# Patient Record
Sex: Female | Born: 2000 | Race: White | Hispanic: No | Marital: Single | Smoking: Never smoker
Health system: Southern US, Community
[De-identification: ages and names within clinical notes are randomized; demographics above are authoritative.]

## PROBLEM LIST (undated history)

## (undated) DIAGNOSIS — F419 Anxiety disorder, unspecified: Secondary | ICD-10-CM

## (undated) DIAGNOSIS — F840 Autistic disorder: Secondary | ICD-10-CM

## (undated) DIAGNOSIS — F32A Depression, unspecified: Secondary | ICD-10-CM

## (undated) DIAGNOSIS — F909 Attention-deficit hyperactivity disorder, unspecified type: Secondary | ICD-10-CM

## (undated) HISTORY — PX: FOOT SURGERY: SHX648

## (undated) HISTORY — DX: Autistic disorder: F84.0

## (undated) HISTORY — DX: Attention-deficit hyperactivity disorder, unspecified type: F90.9

---
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
## 2022-01-01 ENCOUNTER — Ambulatory Visit: Payer: BC Managed Care – PPO | Admitting: Family

## 2022-01-01 ENCOUNTER — Encounter: Payer: Self-pay | Admitting: Family

## 2022-01-01 VITALS — BP 110/78 | HR 96 | Temp 98.1°F | Ht 61.0 in | Wt 170.5 lb

## 2022-01-01 DIAGNOSIS — R519 Headache, unspecified: Secondary | ICD-10-CM

## 2022-01-01 HISTORY — DX: Headache, unspecified: R51.9

## 2022-01-01 MED ORDER — CYCLOBENZAPRINE HCL 5 MG PO TABS: 5.0000 mg | ORAL_TABLET | Freq: Three times a day (TID) | ORAL | 0 refills | Status: DC | PRN

## 2022-01-01 MED ORDER — PROPRANOLOL HCL 10 MG PO TABS: 10.0000 mg | ORAL_TABLET | Freq: Two times a day (BID) | ORAL | 0 refills | Status: DC

## 2022-01-01 NOTE — Patient Instructions (Addendum)
It was very nice to see you today!   I have sent over Propranolol a medication to help prevent your headaches. Start this twice a day and if needed you can increase to 3 times per day if still having 1-2 headaches per week. This medicine is safe to take with your other meds. Continue to take the Flexeril I sent last time to treat your acute headache pain.  Follow up in 1 month.    PLEASE NOTE:  If you had any lab tests please let us know if you have not heard back within a few days. You may see your results on MyChart before we have a chance to review them but we will give you a call once they are reviewed by [place]. If we ordered any referrals today, please let us know if you have not heard from their office within the next week.

## 2022-01-01 NOTE — Progress Notes (Signed)
Subjective:     Patient ID: Amber Watts, female    DOB: Nov 25, 2000, 21 y.o.   MRN: [id number]  Chief Complaint  Patient presents with   Headache    Pt states her headaches are still the same Bt the medication she was given does help but headaches do come back.     HPI: Persistent headaches:  reports having headaches at least 2 per week, had more severe migraines 4 years ago. Reports nausea and dizziness sometimes. Most episodes she reports waking with the headache. Denies any known food allergies, but does feel bad after eating pork. Allergy panel run last visit negative. She reports her Cymbalta dose increased 2 months ago, but she discussed the med as a possible cause of her HA with her psychiatrist who dismissed the idea.   Assessment & Plan:   Problem List Items Addressed This Visit       Other   Persistent headaches - Primary    pt reports Flexeril helping but the HA returns and can continue for 3-4 days. she has asked her PSYCH about Cymbalta causing HA, but they said no. Will send propranolol for preventative. Continue Flexeril prn for acute pain. Advised on use & SE.      Relevant Medications   propranolol (INDERAL) 10 MG tablet   cyclobenzaprine (FLEXERIL) 5 MG tablet    Outpatient Medications Prior to Visit  Medication Sig Dispense Refill   clindamycin (CLEOCIN T) 1 % lotion      DULoxetine (CYMBALTA) 30 MG capsule Take 3 capsules (90 mg total) by mouth daily. 270 capsule 0   ISOtretinoin (ACCUTANE) 30 MG capsule Take 30 mg by mouth daily.     mupirocin ointment (BACTROBAN) 2 % 3 (three) times daily.     sertraline (ZOLOFT) 25 MG tablet BEGIN 5 DAYS BEFORE MENSES BEGINS AND DAYS 1 AND 2 OF CYCLE, THEN STOP 90 tablet 1   Adapalene 0.3 % gel      cyclobenzaprine (FLEXERIL) 5 MG tablet Take 1-2 tablets (5-10 mg total) by mouth 3 (three) times daily as needed for muscle spasms (Headaches). 20 tablet 0   hydrOXYzine (ATARAX) 25 MG tablet TAKE 1 TO 2 TABLETS (25 TO 50  MG TOTAL) BY MOUTH AT BEDTIME (Patient not taking: Reported on 01/01/2022) 180 tablet 1   No facility-administered medications prior to visit.    Past Medical History:  Diagnosis Date   ADHD (attention deficit hyperactivity disorder)    Anxiety    Depression     Past Surgical History:  Procedure Laterality Date   FOOT SURGERY      No Known Allergies     Objective:    Physical Exam Vitals and nursing note reviewed.  Constitutional:      Appearance: Normal appearance.  Cardiovascular:     Rate and Rhythm: Normal rate and regular rhythm.  Pulmonary:     Effort: Pulmonary effort is normal.     Breath sounds: Normal breath sounds.  Musculoskeletal:        General: Normal range of motion.  Skin:    General: Skin is warm and dry.  Neurological:     Mental Status: She is alert.  Psychiatric:        Mood and Affect: Mood normal.        Behavior: Behavior normal.     BP 110/78 (BP Location: Left Arm, Patient Position: Sitting, Cuff Size: Large)   Pulse 96   Temp 98.1 F (36.7 C) (Temporal)   Ht  5\' 1"  (1.549 m)   Wt 170 lb 8 oz (77.3 kg)   LMP 12/23/2021 (Exact Date)   SpO2 99%   BMI 32.22 kg/m  Wt Readings from Last 3 Encounters:  01/01/22 170 lb 8 oz (77.3 kg) (92 %, Z= 1.41)*  12/04/21 169 lb 8 oz (76.9 kg) (92 %, Z= 1.39)*  07/03/21 166 lb (75.3 kg) (91 %, Z= 1.34)*   * Growth percentiles are based on CDC (Girls, 2-20 Years) data.        Meds ordered this encounter  Medications   propranolol (INDERAL) 10 MG tablet    Sig: Take 1 tablet (10 mg total) by mouth 2 (two) times daily. For headache prevention. OK to increase to 3 times daily if needed.    Dispense:  60 tablet    Refill:  0    Order Specific Question:   Supervising Provider    Answer:   ANDY, CAMILLE L [[id number]]   cyclobenzaprine (FLEXERIL) 5 MG tablet    Sig: Take 1-2 tablets (5-10 mg total) by mouth 3 (three) times daily as needed for muscle spasms (Headaches).    Dispense:  30 tablet     Refill:  0    Order Specific Question:   Supervising Provider    Answer:   ANDY, CAMILLE L [[id number]]    08/31/21, NP

## 2022-01-01 NOTE — Assessment & Plan Note (Addendum)
pt reports Flexeril helping but the HA returns and can continue for 3-4 days. she has asked her PSYCH about Cymbalta causing HA, but they said no. Will send propranolol for preventative. Continue Flexeril prn for acute pain. Advised on use & SE.

## 2022-01-20 ENCOUNTER — Other Ambulatory Visit: Payer: Self-pay | Admitting: Physician Assistant

## 2022-01-31 ENCOUNTER — Encounter: Payer: Self-pay | Admitting: Family

## 2022-01-31 ENCOUNTER — Ambulatory Visit: Payer: BC Managed Care – PPO | Admitting: Family

## 2022-01-31 VITALS — BP 110/62 | HR 86 | Temp 99.0°F | Ht 61.0 in | Wt 172.2 lb

## 2022-01-31 DIAGNOSIS — R519 Headache, unspecified: Secondary | ICD-10-CM | POA: Diagnosis not present

## 2022-01-31 DIAGNOSIS — R52 Pain, unspecified: Secondary | ICD-10-CM | POA: Diagnosis not present

## 2022-01-31 MED ORDER — PROPRANOLOL HCL 10 MG PO TABS: 10.0000 mg | ORAL_TABLET | Freq: Two times a day (BID) | ORAL | 2 refills | Status: DC

## 2022-01-31 NOTE — Patient Instructions (Signed)
It was very nice to see you today!   As discussed, try different exercises to try and prevent overusing your muscles and joints doing same exercise. Also encourage gentle weight lifting to build strength.  Recommend working with a Scientist, physiological at [employer] knowledgeable with how to use the gym equipment.  Swimming is a great overall body exercise if your gym has a pool.  You can take up 600mg  of Ibuprofen (Advil) 3 times per day, or 1-2 generic Aleve twice a day to help with your aches/pains as needed. Taking for 5-7 days is safe, but if you are having to take every day to relieve pain, then we need to discuss other options.  Have a great weekend!    PLEASE NOTE:  If you had any lab tests please let know if you have not heard back within a few days. You may see your results on MyChart before we have a chance to review them but we will give you a call once they are reviewed by [place]. If we ordered any referrals today, please let us know if you have not heard from their office within the next week.

## 2022-01-31 NOTE — Assessment & Plan Note (Addendum)
   flexeril helps but HA returns  propranolol 10mg  tid prn given last visit, pt reports decrease in # of headaches, states she is taking 1 pill bid  advised ok to increase to tid, or can take 2 pills at one time  Refill sent to pharmacy  f/u in 3 mos or prn

## 2022-01-31 NOTE — Progress Notes (Signed)
Patient ID: Symphony Demuro, female    DOB: [birth year], 21 y.o.   MRN: [id number]  Chief Complaint  Patient presents with   Headache    Follow up, Pt states headaches have been better.    Generalized Body Aches    Pt c/o generalized body aches and stiffness for a while. Pt states it is on and off.     HPI: Persistent headaches:  previously reported having headaches at least 2 per week, had more severe migraines 4 years ago.  Denies any known food allergies, but does feel bad after eating pork. Allergy panel negative. Started Propranolol and states it is helping reduce her headache down to 1 per week, usually starts during the day vs. waking with HA. General body aches:  reports feeling achy and stiffness when she walks for exercise, denies any pain when resting. Denies any joint pain or paresthesias.   Assessment & Plan:   Problem List Items Addressed This Visit       Other   Persistent headaches - Primary    flexeril helps but HA returns propranolol 10mg  tid prn given last visit, pt reports decrease in # of headaches, states she is taking 1 pill bid advised ok to increase to tid, or can take 2 pills at one time Refill sent to pharmacy f/u in 3 mos or prn      Relevant Medications   propranolol (INDERAL) 10 MG tablet   Other Visit Diagnoses     Generalized body aches    - Advised pt on proper daily hydration of 2L water. Change exercise and try different equipment at [facility] rec center, can ask trainers to demonstrate different things to try. Encourage daily exercise, avoid eating too many carbs and sweets. Can take 1000mg  of Tylenol tid, 600mg  Ibuprofen tid, or 550mg  of Aleve bid to help with pain prn.      Subjective:    Outpatient Medications Prior to Visit  Medication Sig Dispense Refill   clindamycin (CLEOCIN T) 1 % lotion      cyclobenzaprine (FLEXERIL) 5 MG tablet Take 1-2 tablets (5-10 mg total) by mouth 3 (three) times daily as needed for muscle spasms (Headaches).  30 tablet 0   DULoxetine (CYMBALTA) 30 MG capsule Take 3 capsules (90 mg total) by mouth daily. 270 capsule 0   hydrOXYzine (ATARAX) 25 MG tablet TAKE 1 TO 2 TABLETS (25 TO 50 MG TOTAL) BY MOUTH AT BEDTIME 180 tablet 1   ISOtretinoin (ACCUTANE) 30 MG capsule Take 30 mg by mouth daily.     mupirocin ointment (BACTROBAN) 2 % 3 (three) times daily.     sertraline (ZOLOFT) 25 MG tablet BEGIN 5 DAYS BEFORE MENSES BEGINS AND DAYS 1 AND 2 OF CYCLE, THEN STOP 90 tablet 1   propranolol (INDERAL) 10 MG tablet Take 1 tablet (10 mg total) by mouth 2 (two) times daily. For headache prevention. OK to increase to 3 times daily if needed. 60 tablet 0   No facility-administered medications prior to visit.   Past Medical History:  Diagnosis Date   ADHD (attention deficit hyperactivity disorder)    Anxiety    Depression    Past Surgical History:  Procedure Laterality Date   FOOT SURGERY     No Known Allergies    Objective:    Physical Exam Vitals and nursing note reviewed.  Constitutional:      Appearance: Normal appearance.  Cardiovascular:     Rate and Rhythm: Normal rate and regular rhythm.  Pulmonary:  Effort: Pulmonary effort is normal.     Breath sounds: Normal breath sounds.  Musculoskeletal:        General: Normal range of motion.  Skin:    General: Skin is warm and dry.  Neurological:     Mental Status: She is alert.  Psychiatric:        Mood and Affect: Mood normal.        Behavior: Behavior normal.    BP 110/62 (BP Location: Left Arm, Patient Position: Sitting, Cuff Size: Large)   Pulse 86   Temp 99 F (37.2 C) (Temporal)   Ht 5\' 1"  (1.549 m)   Wt 172 lb 4 oz (78.1 kg)   LMP 01/21/2022 (Exact Date)   SpO2 98%   BMI 32.55 kg/m  Wt Readings from Last 3 Encounters:  01/31/22 172 lb 4 oz (78.1 kg) (93 %, Z= 1.44)*  01/01/22 170 lb 8 oz (77.3 kg) (92 %, Z= 1.41)*  12/04/21 169 lb 8 oz (76.9 kg) (92 %, Z= 1.39)*   * Growth percentiles are based on CDC (Girls, 2-20  Years) data.      02/03/22, NP

## 2022-02-26 ENCOUNTER — Ambulatory Visit: Payer: BC Managed Care – PPO | Admitting: Physician Assistant

## 2022-03-05 ENCOUNTER — Encounter: Payer: Self-pay | Admitting: Physician Assistant

## 2022-03-05 ENCOUNTER — Ambulatory Visit: Payer: BC Managed Care – PPO | Admitting: Physician Assistant

## 2022-03-05 VITALS — BP 117/78 | HR 67

## 2022-03-05 DIAGNOSIS — G4700 Insomnia, unspecified: Secondary | ICD-10-CM

## 2022-03-05 DIAGNOSIS — F900 Attention-deficit hyperactivity disorder, predominantly inattentive type: Secondary | ICD-10-CM

## 2022-03-05 DIAGNOSIS — F411 Generalized anxiety disorder: Secondary | ICD-10-CM

## 2022-03-05 DIAGNOSIS — F3341 Major depressive disorder, recurrent, in partial remission: Secondary | ICD-10-CM | POA: Diagnosis not present

## 2022-03-05 MED ORDER — LISDEXAMFETAMINE DIMESYLATE 20 MG PO CAPS: 20.0000 mg | ORAL_CAPSULE | Freq: Every day | ORAL | 0 refills | Status: DC

## 2022-03-05 NOTE — Progress Notes (Signed)
[facility]  Patient ID: Amber Watts,  MRN: [id number]  PCP: Dulce Sellar, NP  Date of Evaluation: 03/05/2022 Time spent:20 minutes  Chief Complaint:  Chief Complaint   ADD    HISTORY/CURRENT STATUS: HPI Would like to re-try something for attention  Took Focalin XR in the past and it really helped, but caused tremor in hands.  The tremor went away when she stopped the Focalin.  The tremor never affected her eating or writing.  She is wondering if she could go back on that or takes something else that would be helpful.  She has a hard time focusing.  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  She enjoys her classes but her mind wanders a lot.  She has 2 classes in the late afternoon and it is even more difficult than.  She procrastinates a lot which did not happen when she was on the Focalin in the past.  Patient is able to enjoy things.  Energy and motivation are good.  No extreme sadness, tearfulness, or feelings of hopelessness.  Sleeps well most of the time, takes the hydroxyzine every evening.  ADLs and personal hygiene are normal.   Appetite has not changed.  Weight is stable.  Denies laxative use, calorie restricting, or binging and purging.   Denies cutting or any form of self-harm.  Denies suicidal or homicidal thoughts.  She is still on Accutane for acne.  The treatment is going well.  Zoloft is helpful for PMDD.  Not having excessive anxiety.  She is on propranolol for migraine prevention.  Denies dizziness, syncope, seizures, numbness, tingling, tremor, tics, unsteady gait, slurred speech, confusion. Denies muscle or joint pain, stiffness, or dystonia.  Individual Medical History/ Review of Systems: Changes? :No   Past medications for mental health diagnoses include: Prozac, Lexapro, Focalin XR caused tremor in hands, Hydroxyzine, Buspar, Zoloft premenstrually  Allergies: Patient has no known allergies.  Current Medications:  Current  Outpatient Medications:    cyclobenzaprine (FLEXERIL) 5 MG tablet, Take 1-2 tablets (5-10 mg total) by mouth 3 (three) times daily as needed for muscle spasms (Headaches)., Disp: 30 tablet, Rfl: 0   DULoxetine (CYMBALTA) 30 MG capsule, Take 3 capsules (90 mg total) by mouth daily., Disp: 270 capsule, Rfl: 0   hydrOXYzine (ATARAX) 25 MG tablet, TAKE 1 TO 2 TABLETS (25 TO 50 MG TOTAL) BY MOUTH AT BEDTIME, Disp: 180 tablet, Rfl: 1   ISOtretinoin (ACCUTANE) 30 MG capsule, Take 30 mg by mouth daily., Disp: , Rfl:    lisdexamfetamine (VYVANSE) 20 MG capsule, Take 1 capsule (20 mg total) by mouth daily., Disp: 30 capsule, Rfl: 0   propranolol (INDERAL) 10 MG tablet, Take 1-2 tablets (10-20 mg total) by mouth 2 (two) times daily. For headache prevention. OK to increase to 3 times daily if needed., Disp: 75 tablet, Rfl: 2   sertraline (ZOLOFT) 25 MG tablet, BEGIN 5 DAYS BEFORE MENSES BEGINS AND DAYS 1 AND 2 OF CYCLE, THEN STOP, Disp: 90 tablet, Rfl: 1   clindamycin (CLEOCIN T) 1 % lotion, , Disp: , Rfl:    mupirocin ointment (BACTROBAN) 2 %, 3 (three) times daily. (Patient not taking: Reported on 03/05/2022), Disp: , Rfl:  Medication Side Effects: none  Family Medical/ Social History: Changes? No  MENTAL HEALTH EXAM:  Blood pressure 117/78, pulse 67.There is no height or weight on file to calculate BMI.  General Appearance: Casual and Well Groomed  Eye Contact:  Good  Speech:  Clear and  Coherent and Normal Rate  Volume:  Normal  Mood:  Euthymic  Affect:  Congruent  Thought Process:  Goal Directed and Descriptions of Associations: Circumstantial  Orientation:  Full (Time, Place, and Person)  Thought Content: Logical   Suicidal Thoughts:  No  Homicidal Thoughts:  No  Memory:  WNL  Judgement:  Good  Insight:  Good  Psychomotor Activity:  Normal  Concentration:  Concentration: Fair and Attention Span: Fair  Recall:  Good  Fund of Knowledge: Good  Language: Good  Assets:  Desire for Improvement   ADL's:  Intact  Cognition: WNL  Prognosis:  Good   DIAGNOSES:    ICD-10-CM   1. Attention deficit hyperactivity disorder (ADHD), predominantly inattentive type  F90.0     2. Recurrent major depression in partial remission (HCC)  F33.41     3. Generalized anxiety disorder  F41.1     4. Insomnia, unspecified type  G47.00       Receiving Psychotherapy: Yes    [name]  RECOMMENDATIONS:  PDMP was reviewed.  Last Focalin filled 03/25/2021. I provided 20 minutes of face to face time during this encounter, including time spent before and after the visit in records review, medical decision making, counseling pertinent to today's visit, and charting.   We discussed different treatment options for ADD.  She has only taken Focalin in the past.  I recommend trying something in the amphetamine group, specifically Vyvanse, as there is usually a smoother release of the active ingredient throughout the day.  We discussed benefits, risks and possible side effects and she accepts.  Continue Cymbalta 90 mg daily. Continue hydroxyzine 25 mg, 1-2 p.o. nightly as needed sleep. Start Vyvanse 20 mg, 1 p.o. every morning. Continue Zoloft 25 mg p.o. daily beginning 5 days prior to menses and then days 1 and 2 of the cycle and then stop it.  Continue multivitamin, B complex, and iron. Continue therapy with [name]. Return in 4 weeks.  [name], PA-C

## 2022-03-08 ENCOUNTER — Other Ambulatory Visit: Payer: Self-pay | Admitting: Physician Assistant

## 2022-03-17 ENCOUNTER — Ambulatory Visit: Payer: BC Managed Care – PPO | Admitting: Physician Assistant

## 2022-03-21 ENCOUNTER — Ambulatory Visit: Payer: BC Managed Care – PPO | Admitting: Physician Assistant

## 2022-03-24 ENCOUNTER — Encounter: Payer: Self-pay | Admitting: *Deleted

## 2022-04-02 ENCOUNTER — Encounter: Payer: Self-pay | Admitting: Physician Assistant

## 2022-04-02 ENCOUNTER — Ambulatory Visit (INDEPENDENT_AMBULATORY_CARE_PROVIDER_SITE_OTHER): Payer: BC Managed Care – PPO | Admitting: Physician Assistant

## 2022-04-02 DIAGNOSIS — F3341 Major depressive disorder, recurrent, in partial remission: Secondary | ICD-10-CM | POA: Diagnosis not present

## 2022-04-02 DIAGNOSIS — F411 Generalized anxiety disorder: Secondary | ICD-10-CM

## 2022-04-02 DIAGNOSIS — F3281 Premenstrual dysphoric disorder: Secondary | ICD-10-CM | POA: Diagnosis not present

## 2022-04-02 DIAGNOSIS — F900 Attention-deficit hyperactivity disorder, predominantly inattentive type: Secondary | ICD-10-CM

## 2022-04-02 MED ORDER — LISDEXAMFETAMINE DIMESYLATE 40 MG PO CAPS: 40.0000 mg | ORAL_CAPSULE | ORAL | 0 refills | Status: DC

## 2022-04-02 NOTE — Progress Notes (Signed)
[facility]  Patient ID: Amber Watts,  MRN: [id number]  PCP: Amber Sewer, NP  Date of Evaluation: 04/02/2022 Time spent:20 minutes  Chief Complaint:  Chief Complaint   Anxiety; Depression; ADD; Insomnia     HISTORY/CURRENT STATUS: HPI For routine med check  Vyvanse was started last month. It didn't help much. Still has trouble focusing, but also no energy or motivation. Has missed some of her college classes. Doesn't feel like doing anything. Hard time getting up and going. Doesn't sleep well. Hard time going to sleep and staying asleep some nights, the majority of time she sleeps okay but still feels very tired when she gets up.  Does not feel depressed.  The Cymbalta has helped that just not energy and motivation. No extreme sadness, tearfulness, or feelings of hopelessness.  ADLs and personal hygiene are normal.   Appetite has not changed.  Weight is stable.  Not isolating.  Does not cry easily.  Denies suicidal or homicidal thoughts.  She was told by the pharmacy she should not take the Zoloft with Cymbalta.  So she held it for this last menstrual cycle.  She felt much worse than previous cycles.  Did not know how much it helped with severe PMS symptoms until she did not take it.  She was more emotional during that week than she had been when on it.  Patient denies increased energy with decreased need for sleep, increased talkativeness, racing thoughts, impulsivity or risky behaviors, increased spending, increased libido, grandiosity, increased irritability or anger, paranoia, or hallucinations.  Denies dizziness, syncope, seizures, numbness, tingling, tremor, tics, unsteady gait, slurred speech, confusion. Denies muscle or joint pain, stiffness, or dystonia.  Individual Medical History/ Review of Systems: Changes? :No   Past medications for mental health diagnoses include: Prozac, Lexapro, Focalin XR caused tremor in hands, Wellbutrin was ineffective,  Hydroxyzine, Buspar, Zoloft premenstrually  Allergies: Patient has no known allergies.  Current Medications:  Current Outpatient Medications:    cyclobenzaprine (FLEXERIL) 5 MG tablet, Take 1-2 tablets (5-10 mg total) by mouth 3 (three) times daily as needed for muscle spasms (Headaches)., Disp: 30 tablet, Rfl: 0   DULoxetine (CYMBALTA) 30 MG capsule, TAKE 1 CAPSULE (30 MG TOTAL) BY MOUTH DAILY. WITH THE 60 MG=90 MG DAILY., Disp: 90 capsule, Rfl: 0   hydrOXYzine (ATARAX) 25 MG tablet, TAKE 1 TO 2 TABLETS (25 TO 50 MG TOTAL) BY MOUTH AT BEDTIME, Disp: 180 tablet, Rfl: 1   ISOtretinoin (ACCUTANE) 30 MG capsule, Take 30 mg by mouth daily., Disp: , Rfl:    lisdexamfetamine (VYVANSE) 40 MG capsule, Take 1 capsule (40 mg total) by mouth every morning., Disp: 30 capsule, Rfl: 0   propranolol (INDERAL) 10 MG tablet, Take 1-2 tablets (10-20 mg total) by mouth 2 (two) times daily. For headache prevention. OK to increase to 3 times daily if needed., Disp: 75 tablet, Rfl: 2   sertraline (ZOLOFT) 25 MG tablet, BEGIN 5 DAYS BEFORE MENSES BEGINS AND DAYS 1 AND 2 OF CYCLE, THEN STOP, Disp: 90 tablet, Rfl: 1   clindamycin (CLEOCIN T) 1 % lotion, , Disp: , Rfl:    mupirocin ointment (BACTROBAN) 2 %, 3 (three) times daily. (Patient not taking: Reported on 03/05/2022), Disp: , Rfl:  Medication Side Effects: none  Family Medical/ Social History: Changes? No  MENTAL HEALTH EXAM:  There were no vitals taken for this visit.There is no height or weight on file to calculate BMI.  General Appearance: Casual, Well Groomed, and cystic acne on face  Eye Contact:  Good  Speech:  Clear and Coherent and Normal Rate  Volume:  Normal  Mood:  Euthymic  Affect:  Congruent  Thought Process:  Goal Directed and Descriptions of Associations: Circumstantial  Orientation:  Full (Time, Place, and Person)  Thought Content: Logical   Suicidal Thoughts:  No  Homicidal Thoughts:  No  Memory:  WNL  Judgement:  Good  Insight:  Good   Psychomotor Activity:  Normal  Concentration:  Concentration: Fair and Attention Span: Fair  Recall:  Good  Fund of Knowledge: Good  Language: Good  Assets:  Desire for Improvement Financial Resources/Insurance Housing Transportation Vocational/Educational  ADL's:  Intact  Cognition: WNL  Prognosis:  Good   DIAGNOSES:    ICD-10-CM   1. PMDD (premenstrual dysphoric disorder)  F32.81     2. Attention deficit hyperactivity disorder (ADHD), predominantly inattentive type  F90.0     3. Recurrent major depression in partial remission (HCC)  F33.41     4. Generalized anxiety disorder  F41.1      Receiving Psychotherapy: Yes    Amber Watts  RECOMMENDATIONS:  PDMP was reviewed.  Last Vyvanse was filled 03/05/2022. I provided 20 minutes of face to face time during this encounter, including time spent before and after the visit in records review, medical decision making, counseling pertinent to today's visit, and charting.   We discussed the lack of energy and motivation.  I think increasing the Vyvanse dose will help that some of course also will help with focus and attention.  She would like to proceed. Another option would be to increase the Cymbalta however I do not think that will be as beneficial and the Vyvanse dose needs to be increased anyway. Wellbutrin, or Auvelity, may be an option.  She remembers taking Wellbutrin in the past and it did not help at all.  Uncertain of previous dose. We discussed the Zoloft.  It is safe to take for the 5 to 7 days prior to menses, even though she is taking Cymbalta.  She will restart that before her next cycle.  Continue Cymbalta 90 mg daily. Continue hydroxyzine 25 mg, 1-2 p.o. nightly as needed sleep. Increase Vyvanse to 40 mg, 1 p.o. every morning. Continue Zoloft 25 mg p.o. daily beginning 5 days prior to menses and then days 1 and 2 of the cycle and then stop it.  Continue multivitamin, B complex, and iron. Continue therapy with  Amber Watts. Return in 4 weeks.  Amber Overly, PA-C

## 2022-04-14 ENCOUNTER — Other Ambulatory Visit: Payer: Self-pay | Admitting: Physician Assistant

## 2022-04-30 ENCOUNTER — Ambulatory Visit (INDEPENDENT_AMBULATORY_CARE_PROVIDER_SITE_OTHER): Payer: BC Managed Care – PPO | Admitting: Physician Assistant

## 2022-04-30 ENCOUNTER — Encounter: Payer: Self-pay | Admitting: Physician Assistant

## 2022-04-30 DIAGNOSIS — F3281 Premenstrual dysphoric disorder: Secondary | ICD-10-CM

## 2022-04-30 DIAGNOSIS — F411 Generalized anxiety disorder: Secondary | ICD-10-CM | POA: Diagnosis not present

## 2022-04-30 DIAGNOSIS — F32A Depression, unspecified: Secondary | ICD-10-CM

## 2022-04-30 DIAGNOSIS — F900 Attention-deficit hyperactivity disorder, predominantly inattentive type: Secondary | ICD-10-CM

## 2022-04-30 DIAGNOSIS — F331 Major depressive disorder, recurrent, moderate: Secondary | ICD-10-CM

## 2022-04-30 DIAGNOSIS — G4700 Insomnia, unspecified: Secondary | ICD-10-CM

## 2022-04-30 MED ORDER — TRAZODONE HCL 100 MG PO TABS: 50.0000 mg | ORAL_TABLET | Freq: Every evening | ORAL | 1 refills | Status: DC | PRN

## 2022-04-30 MED ORDER — DULOXETINE HCL 60 MG PO CPEP: 120.0000 mg | ORAL_CAPSULE | Freq: Every day | ORAL | 0 refills | Status: DC

## 2022-04-30 NOTE — Progress Notes (Signed)
[facility]  Patient ID: Amber Watts,  MRN: [id number]  PCP: Dulce Sellar, NP  Date of Evaluation: 04/30/2022 Time spent:30 minutes  Chief Complaint:  Chief Complaint   Insomnia; Depression; ADD; Follow-up    HISTORY/CURRENT STATUS: HPI For routine med check  Wasn't able to get the Vyvanse for 2 weeks d/t insurance. So only been on the higher dose for 2 weeks. Still having a hard time focusing but not sure if it's d/t the break in treatment or not. Doesn't feel motivated or energetic at all.   She has started putting Legos together for fun.  Other than that she is not doing anything fun.  School is just ok. Is overwhelmed b/c of all the stuff she has to do, and has no motivation to do it all.   No extreme sadness, tearfulness, or feelings of hopelessness.   ADLs and personal hygiene are normal.   Appetite has not changed.  Weight is stable.  Denies suicidal or homicidal thoughts.  Used Zoloft for PMDD this past cycle and it was really helpful.   Anxiety is still an issue, mostly gets overwhelmed with everything. Not sleeping well, trouble falling, and staying asleep. Hydroxyzine used to help but not now.  Started about 2 to 3 weeks ago.  No change in habits such as increased caffeine.  Patient denies increased energy with decreased need for sleep, increased talkativeness, racing thoughts, impulsivity or risky behaviors, increased spending, increased libido, grandiosity, increased irritability or anger, paranoia, or hallucinations.  Denies dizziness, syncope, seizures, numbness, tingling, tremor, tics, unsteady gait, slurred speech, confusion. Denies muscle or joint pain, stiffness, or dystonia.  Individual Medical History/ Review of Systems: Changes? :No   Past medications for mental health diagnoses include: Prozac, Lexapro, Focalin XR caused tremor in hands, Wellbutrin was ineffective, Hydroxyzine, Buspar, Zoloft premenstrually  Allergies: Patient has no  known allergies.  Current Medications:  Current Outpatient Medications:    cyclobenzaprine (FLEXERIL) 5 MG tablet, Take 1-2 tablets (5-10 mg total) by mouth 3 (three) times daily as needed for muscle spasms (Headaches)., Disp: 30 tablet, Rfl: 0   hydrOXYzine (ATARAX) 25 MG tablet, TAKE 1 TO 2 TABLETS (25 TO 50 MG TOTAL) BY MOUTH AT BEDTIME, Disp: 180 tablet, Rfl: 1   ISOtretinoin (ACCUTANE) 30 MG capsule, Take 30 mg by mouth daily., Disp: , Rfl:    lisdexamfetamine (VYVANSE) 40 MG capsule, Take 1 capsule (40 mg total) by mouth every morning., Disp: 30 capsule, Rfl: 0   propranolol (INDERAL) 10 MG tablet, Take 1-2 tablets (10-20 mg total) by mouth 2 (two) times daily. For headache prevention. OK to increase to 3 times daily if needed., Disp: 75 tablet, Rfl: 2   sertraline (ZOLOFT) 25 MG tablet, BEGIN 5 DAYS BEFORE MENSES BEGINS AND DAYS 1 AND 2 OF CYCLE, THEN STOP, Disp: 90 tablet, Rfl: 1   traZODone (DESYREL) 100 MG tablet, Take 0.5-1 tablets (50-100 mg total) by mouth at bedtime as needed for sleep., Disp: 30 tablet, Rfl: 1   clindamycin (CLEOCIN T) 1 % lotion, , Disp: , Rfl:    DULoxetine (CYMBALTA) 60 MG capsule, Take 2 capsules (120 mg total) by mouth daily., Disp: 180 capsule, Rfl: 0   mupirocin ointment (BACTROBAN) 2 %, 3 (three) times daily. (Patient not taking: Reported on 03/05/2022), Disp: , Rfl:  Medication Side Effects: none  Family Medical/ Social History: Changes? No  MENTAL HEALTH EXAM:  There were no vitals taken for this visit.There is no height or weight on file to  calculate BMI.  General Appearance: Casual and Well Groomed  Eye Contact:  Good  Speech:  Clear and Coherent and Normal Rate  Volume:  Normal  Mood:   Sad  Affect:  Congruent  Thought Process:  Goal Directed and Descriptions of Associations: Circumstantial  Orientation:  Full (Time, Place, and Person)  Thought Content: Logical   Suicidal Thoughts:  No  Homicidal Thoughts:  No  Memory:  WNL  Judgement:  Good   Insight:  Good  Psychomotor Activity:  Normal  Concentration:  Concentration: Fair and Attention Span: Fair  Recall:  Good  Fund of Knowledge: Good  Language: Good  Assets:  Desire for Improvement Financial Resources/Insurance Housing Transportation Vocational/Educational  ADL's:  Intact  Cognition: WNL  Prognosis:  Good   DIAGNOSES:    ICD-10-CM   1. Attention deficit hyperactivity disorder (ADHD), predominantly inattentive type  F90.0     2. Major depressive disorder, recurrent episode, moderate (HCC)  F33.1     3. Melancholy  F32.A     4. Generalized anxiety disorder  F41.1     5. Insomnia, unspecified type  G47.00     6. PMDD (premenstrual dysphoric disorder)  F32.81       Receiving Psychotherapy: Yes    [name]  RECOMMENDATIONS:  PDMP was reviewed.  Last Vyvanse was filled 04/19/2022. I provided 30 minutes of face to face time during this encounter, including time spent before and after the visit in records review, medical decision making, counseling pertinent to today's visit, and charting.   We discussed the melancholy.  I recommend increasing the Cymbalta to max and if that does not help in 4 to 6 weeks we will change antidepressants. Sleep hygiene discussed.  Hydroxyzine is not helping any longer, recommend trazodone.  Benefits, risk and side effects were discussed and she accepts. No other changes at this time.  Increase Cymbalta 60 mg to 2 p.o. daily. Continue hydroxyzine 25 mg, 1-2 p.o. nightly as needed sleep. Continue Vyvanse 40 mg, 1 p.o. every morning. Continue Zoloft 25 mg p.o. daily beginning 5 days prior to menses and then days 1 and 2 of the cycle and then stop it. Start trazodone 100 mg, 1/2-1 p.o. nightly as needed sleep. Continue multivitamin, B complex, and iron. Continue therapy with Evelena Peat Deussing. Return in 4-6 weeks.  Donnal Moat, PA-C

## 2022-05-02 ENCOUNTER — Ambulatory Visit: Payer: BC Managed Care – PPO | Admitting: Family

## 2022-05-02 ENCOUNTER — Encounter: Payer: Self-pay | Admitting: Family

## 2022-05-02 VITALS — BP 125/81 | HR 80 | Temp 98.4°F | Ht 61.0 in | Wt 173.0 lb

## 2022-05-02 DIAGNOSIS — R519 Headache, unspecified: Secondary | ICD-10-CM | POA: Diagnosis not present

## 2022-05-02 DIAGNOSIS — R000 Tachycardia, unspecified: Secondary | ICD-10-CM

## 2022-05-02 MED ORDER — NAPROXEN 500 MG PO TABS: 500.0000 mg | ORAL_TABLET | Freq: Two times a day (BID) | ORAL | 2 refills | Status: DC

## 2022-05-02 MED ORDER — PROPRANOLOL HCL 10 MG PO TABS: 20.0000 mg | ORAL_TABLET | Freq: Two times a day (BID) | ORAL | 2 refills | Status: DC

## 2022-05-02 NOTE — Assessment & Plan Note (Addendum)
chronic feeling HA going to her neck and then down her back & also behind eyes, feels pressure w/blurriness at times tingling sensation on her scalp as HA is starting Flexeril 5mg  has not helped, sometimes takes another after an hour  advised to increase the Propranolol to 20mg  bid and Flexeril to 2 tabs at first sign of HA along with 1 Naproxen tab, sending to pharmacy check in via [place] if above plan helping f/u 4-42mos or prn

## 2022-05-02 NOTE — Progress Notes (Signed)
Patient ID: Amber Watts, female    DOB: 08-03-2000, 21 y.o.   MRN: [id number]  Chief Complaint  Patient presents with   Headache    Follow up, Getting better then went back to the same.    Palpitations    Pt c/o heart palpitations, Migraines, lightheadedness and fatigue. Would like to discuss Postural orthostatic tachycardia syndrome.    HPI: Persistent headaches:  previously reported having headaches at least 2 per week, had more severe migraines 4 years ago.  Denies any known food allergies, but does feel bad after eating pork. Allergy panel negative. Started Propranolol and stated it  helped reduce her headaches down to 1 per week, but lately it has been back up to 2, sometimes 3 per week.  She also reports having a rapid heart beat with just standing or walking and sometimes feels lightheaded. She has a friend who has POTs and wondering of this is what she has.   Assessment & Plan:   Problem List Items Addressed This Visit       Other   Persistent headaches - Primary    chronic feeling HA going to her neck and then down her back & also behind eyes, feels pressure w/blurriness at times tingling sensation on her scalp as HA is starting Flexeril 5mg  has not helped, sometimes takes another after an hour  advised to increase the Propranolol to 20mg  bid and Flexeril to 2 tabs at first sign of HA along with 1 Naproxen tab, sending to pharmacy check in via Mychart if above plan helping f/u 4-47mos or prn      Relevant Medications   propranolol (INDERAL) 10 MG tablet   naproxen (NAPROSYN) 500 MG tablet   Other Visit Diagnoses     Rapid heart rate    - pt has friend w/POTs & wondering if she has same sx - advised pt her HR going up to 120s is not abnormal with activity at her age and without doing any regular physical exercise. Advised pt on starting an exercise regimen, starting out slowly and building slowly, importance of hydration before, during & after to avoid feeling dizzy.  Handout provided.       Subjective:    Outpatient Medications Prior to Visit  Medication Sig Dispense Refill   clindamycin (CLEOCIN T) 1 % lotion      cyclobenzaprine (FLEXERIL) 5 MG tablet Take 1-2 tablets (5-10 mg total) by mouth 3 (three) times daily as needed for muscle spasms (Headaches). 30 tablet 0   DULoxetine (CYMBALTA) 60 MG capsule Take 2 capsules (120 mg total) by mouth daily. 180 capsule 0   hydrOXYzine (ATARAX) 25 MG tablet TAKE 1 TO 2 TABLETS (25 TO 50 MG TOTAL) BY MOUTH AT BEDTIME 180 tablet 1   ISOtretinoin (ACCUTANE) 30 MG capsule Take 30 mg by mouth daily.     lisdexamfetamine (VYVANSE) 40 MG capsule Take 1 capsule (40 mg total) by mouth every morning. 30 capsule 0   mupirocin ointment (BACTROBAN) 2 % 3 (three) times daily.     sertraline (ZOLOFT) 25 MG tablet BEGIN 5 DAYS BEFORE MENSES BEGINS AND DAYS 1 AND 2 OF CYCLE, THEN STOP 90 tablet 1   traZODone (DESYREL) 100 MG tablet Take 0.5-1 tablets (50-100 mg total) by mouth at bedtime as needed for sleep. 30 tablet 1   propranolol (INDERAL) 10 MG tablet Take 1-2 tablets (10-20 mg total) by mouth 2 (two) times daily. For headache prevention. OK to increase to 3 times daily if  needed. 75 tablet 2   No facility-administered medications prior to visit.   Past Medical History:  Diagnosis Date   ADHD (attention deficit hyperactivity disorder)    Anxiety    Depression    Past Surgical History:  Procedure Laterality Date   FOOT SURGERY     No Known Allergies    Objective:    Physical Exam Vitals and nursing note reviewed.  Constitutional:      Appearance: Normal appearance.  Cardiovascular:     Rate and Rhythm: Normal rate and regular rhythm.  Pulmonary:     Effort: Pulmonary effort is normal.     Breath sounds: Normal breath sounds.  Musculoskeletal:        General: Normal range of motion.  Skin:    General: Skin is warm and dry.  Neurological:     Mental Status: She is alert.  Psychiatric:        Mood and  Affect: Mood normal.        Behavior: Behavior normal.    BP 125/81 (BP Location: Left Arm, Patient Position: Sitting, Cuff Size: Large)   Pulse 80   Temp 98.4 F (36.9 C) (Temporal)   Ht 5\' 1"  (1.549 m)   Wt 173 lb (78.5 kg)   LMP 04/02/2022 (Exact Date)   SpO2 98%   BMI 32.69 kg/m  Wt Readings from Last 3 Encounters:  05/02/22 173 lb (78.5 kg) (93 %, Z= 1.45)*  01/31/22 172 lb 4 oz (78.1 kg) (93 %, Z= 1.44)*  01/01/22 170 lb 8 oz (77.3 kg) (92 %, Z= 1.41)*   * Growth percentiles are based on CDC (Girls, 2-20 Years) data.       Jeanie Sewer, NP

## 2022-05-02 NOTE — Patient Instructions (Signed)
It was very nice to see you today!   As discussed, increase your Propranolol (inderal) to 2 tabs twice a day to prevent your headaches. When you first start to feel a headache coming on, take 2 of the Cyclobenzaprine tabs = 10mg  along with one of the Naproxen tabs that I have sent to your pharmacy.  Start exercising to exercise your heart muscle - start gradually and slowly build up either with speed, intensity, or distance (time). See the handout I attached for more ideas regarding this.  Check in with me in about 2 weeks via MyChart and let me know if this is working better for you.         PLEASE NOTE:  If you had any lab tests please let us know if you have not heard back within a few days. You may see your results on MyChart before we have a chance to review them but we will give you a call once they are reviewed by [place]. If we ordered any referrals today, please let us know if you have not heard from their office within the next week.

## 2022-05-19 ENCOUNTER — Other Ambulatory Visit: Payer: Self-pay

## 2022-05-19 ENCOUNTER — Telehealth: Payer: Self-pay | Admitting: Physician Assistant

## 2022-05-19 MED ORDER — LISDEXAMFETAMINE DIMESYLATE 40 MG PO CAPS: 40.0000 mg | ORAL_CAPSULE | ORAL | 0 refills | Status: DC

## 2022-05-19 NOTE — Telephone Encounter (Signed)
Pt LVM requesting Rx for generic Vyvanse 40 mg [facility] . [address]

## 2022-05-19 NOTE — Telephone Encounter (Signed)
Pended.

## 2022-05-31 ENCOUNTER — Other Ambulatory Visit: Payer: Self-pay | Admitting: Physician Assistant

## 2022-06-05 ENCOUNTER — Ambulatory Visit: Payer: BC Managed Care – PPO | Admitting: Physician Assistant

## 2022-06-06 ENCOUNTER — Other Ambulatory Visit: Payer: Self-pay | Admitting: Physician Assistant

## 2022-06-10 ENCOUNTER — Other Ambulatory Visit: Payer: Self-pay | Admitting: Student

## 2022-06-10 DIAGNOSIS — M25871 Other specified joint disorders, right ankle and foot: Secondary | ICD-10-CM

## 2022-06-12 ENCOUNTER — Encounter: Payer: Self-pay | Admitting: *Deleted

## 2022-07-09 ENCOUNTER — Ambulatory Visit (INDEPENDENT_AMBULATORY_CARE_PROVIDER_SITE_OTHER): Payer: BC Managed Care – PPO | Admitting: Physician Assistant

## 2022-07-09 ENCOUNTER — Encounter: Payer: Self-pay | Admitting: Physician Assistant

## 2022-07-09 DIAGNOSIS — F900 Attention-deficit hyperactivity disorder, predominantly inattentive type: Secondary | ICD-10-CM

## 2022-07-09 DIAGNOSIS — F3281 Premenstrual dysphoric disorder: Secondary | ICD-10-CM

## 2022-07-09 DIAGNOSIS — F411 Generalized anxiety disorder: Secondary | ICD-10-CM

## 2022-07-09 DIAGNOSIS — G4700 Insomnia, unspecified: Secondary | ICD-10-CM

## 2022-07-09 DIAGNOSIS — F331 Major depressive disorder, recurrent, moderate: Secondary | ICD-10-CM

## 2022-07-09 MED ORDER — ACETYLCYSTEINE 600 MG PO CAPS: 600.0000 mg | ORAL_CAPSULE | Freq: Two times a day (BID) | ORAL | 0 refills | Status: DC

## 2022-07-09 MED ORDER — TRAZODONE HCL 100 MG PO TABS: ORAL_TABLET | ORAL | 3 refills | Status: DC

## 2022-07-09 MED ORDER — LISDEXAMFETAMINE DIMESYLATE 40 MG PO CAPS: 40.0000 mg | ORAL_CAPSULE | ORAL | 0 refills | Status: DC

## 2022-07-09 NOTE — Progress Notes (Signed)
[facility]  Patient ID: Amber Watts,  MRN: [id number]  PCP: Jeanie Sewer, NP  Date of Evaluation: 07/09/2022 Time spent:20 minutes  Chief Complaint:  Chief Complaint   Anxiety; Depression; Insomnia; ADD; Follow-up    HISTORY/CURRENT STATUS: HPI For routine med check  Hasn't been able to get the Vyvanse b/c of nationwide shortage. Really hard time staying on task. Grades are ok.   Sleeping better since being on Trazodone.  Able to fall asleep and stay asleep.  The hydroxyzine had stopped working.  PMS has improved with Zoloft premenstrually.  She is able to enjoy things at times.  School is going well.  Energy and motivation are good for the most part.  Not crying easily.  She is in the process of workup for autism spectrum.  She is talking to her counselor about how to go about it.  Appetite is normal and weight is stable.  No laxative use, binging or purging, or calorie restricting.  Personal hygiene is normal.  Had a lot more anxiety last month, not really sure why unless it was the holidays.  She did not increase the Cymbalta as directed at the last visit.  Thinks she just did not remember.  No suicidal or homicidal thoughts.  Patient denies increased energy with decreased need for sleep, increased talkativeness, racing thoughts, impulsivity or risky behaviors, increased spending, increased libido, grandiosity, increased irritability or anger, paranoia, or hallucinations.  Denies dizziness, syncope, seizures, numbness, tingling, tremor, tics, unsteady gait, slurred speech, confusion. Denies muscle or joint pain, stiffness, or dystonia.  Individual Medical History/ Review of Systems: Changes? :No   Past medications for mental health diagnoses include: Prozac, Lexapro, Focalin XR caused tremor in hands, Wellbutrin was ineffective, Hydroxyzine, Buspar, Zoloft premenstrually  Allergies: Patient has no known allergies.  Current Medications:  Current Outpatient  Medications:    Acetylcysteine 600 MG CAPS, Take 1 capsule (600 mg total) by mouth 2 (two) times daily., Disp: , Rfl: 0   b complex vitamins capsule, Take 1 capsule by mouth daily., Disp: , Rfl:    clindamycin (CLEOCIN T) 1 % lotion, , Disp: , Rfl:    cyclobenzaprine (FLEXERIL) 5 MG tablet, Take 1-2 tablets (5-10 mg total) by mouth 3 (three) times daily as needed for muscle spasms (Headaches)., Disp: 30 tablet, Rfl: 0   DULoxetine (CYMBALTA) 60 MG capsule, Take 2 capsules (120 mg total) by mouth daily., Disp: 180 capsule, Rfl: 0   ISOtretinoin (ACCUTANE) 30 MG capsule, Take 30 mg by mouth daily., Disp: , Rfl:    mupirocin ointment (BACTROBAN) 2 %, 3 (three) times daily., Disp: , Rfl:    naproxen (NAPROSYN) 500 MG tablet, Take 1 tablet (500 mg total) by mouth 2 (two) times daily with a meal. Take 1 tab at first sign of headache along with the Cyclobenzaprine (Flexeril), Disp: 15 tablet, Rfl: 2   propranolol (INDERAL) 10 MG tablet, Take 2 tablets (20 mg total) by mouth 2 (two) times daily. For headache prevention. OK to increase to 3 times daily if needed., Disp: 120 tablet, Rfl: 2   sertraline (ZOLOFT) 25 MG tablet, BEGIN 5 DAYS BEFORE MENSES BEGINS AND DAYS 1 AND 2 OF CYCLE, THEN STOP, Disp: 90 tablet, Rfl: 1   lisdexamfetamine (VYVANSE) 40 MG capsule, Take 1 capsule (40 mg total) by mouth every morning., Disp: 30 capsule, Rfl: 0   traZODone (DESYREL) 100 MG tablet, TAKE 1/2 TO 1 TABLET (50-100 MG TOTAL) BY MOUTH AT BEDTIME AS NEEDED FOR SLEEP, Disp: 90  tablet, Rfl: 3 Medication Side Effects: none  Family Medical/ Social History: Changes? No  MENTAL HEALTH EXAM:  There were no vitals taken for this visit.There is no height or weight on file to calculate BMI.  General Appearance: Casual and Well Groomed  Eye Contact:  Good  Speech:  Clear and Coherent and Normal Rate  Volume:  Normal  Mood:  Euthymic  Affect:  Congruent  Thought Process:  Goal Directed and Descriptions of Associations:  Circumstantial  Orientation:  Full (Time, Place, and Person)  Thought Content: Logical   Suicidal Thoughts:  No  Homicidal Thoughts:  No  Memory:  WNL  Judgement:  Good  Insight:  Good  Psychomotor Activity:  Normal  Concentration:  Concentration: Fair and Attention Span: Fair  Recall:  Good  Fund of Knowledge: Good  Language: Good  Assets:  Desire for Improvement Financial Resources/Insurance Housing Transportation Vocational/Educational  ADL's:  Intact  Cognition: WNL  Prognosis:  Good   DIAGNOSES:    ICD-10-CM   1. Attention deficit hyperactivity disorder (ADHD), predominantly inattentive type  F90.0     2. Major depressive disorder, recurrent episode, moderate (HCC)  F33.1     3. Generalized anxiety disorder  F41.1     4. Insomnia, unspecified type  G47.00     5. PMDD (premenstrual dysphoric disorder)  F32.81       Receiving Psychotherapy: Yes    [name]  RECOMMENDATIONS:  PDMP was reviewed.  Last Vyvanse was filled 04/19/2022. I provided 20 minutes of face to face time during this encounter, including time spent before and after the visit in records review, medical decision making, counseling pertinent to today's visit, and charting.   Discussed the Cymbalta.  I think it will be helpful if she increased the dose now.  It should help with the anxiety. Recommend she contact [facility], it will probably be a long wait but they may be able to perform psychological testing on her which would help with the diagnosis of ASD. Discussed supplements, especially NAC which can help with OCD type behaviors, skin picking, word finding.  Start NAC 600 mg, 1 p.o. twice daily. Increase Cymbalta 60 mg, 2 p.o. daily. Continue hydroxyzine 25 mg, 1-2 p.o. nightly as needed sleep. Continue Vyvanse 40 mg, 1 p.o. every morning. Continue propranolol 10 mg, 2 p.o. twice daily for headache prevention. Continue Zoloft 25 mg p.o. daily beginning 5 days prior to menses  and then days 1 and 2 of the cycle and then stop it. Continue trazodone 100 mg, 1/2-1 p.o. nightly as needed sleep. Continue multivitamin, B complex, and iron. Continue therapy with Evelena Peat Deussing. Return in 6 weeks  Donnal Moat, PA-C

## 2022-07-18 ENCOUNTER — Ambulatory Visit
Admission: RE | Admit: 2022-07-18 | Discharge: 2022-07-18 | Disposition: A | Discharge status: Home / Self Care | Payer: BC Managed Care – PPO | Source: Ambulatory Visit | Attending: Student | Admitting: Student

## 2022-07-18 DIAGNOSIS — M25871 Other specified joint disorders, right ankle and foot: Secondary | ICD-10-CM

## 2022-08-01 ENCOUNTER — Ambulatory Visit: Payer: BC Managed Care – PPO | Admitting: Family

## 2022-08-03 ENCOUNTER — Other Ambulatory Visit: Payer: Self-pay | Admitting: Family

## 2022-08-03 DIAGNOSIS — R519 Headache, unspecified: Secondary | ICD-10-CM

## 2022-08-05 ENCOUNTER — Ambulatory Visit: Payer: BC Managed Care – PPO | Admitting: Family

## 2022-08-05 ENCOUNTER — Encounter: Payer: Self-pay | Admitting: Family

## 2022-08-05 ENCOUNTER — Other Ambulatory Visit: Payer: Self-pay | Admitting: Family

## 2022-08-05 VITALS — BP 119/75 | HR 73 | Temp 97.5°F | Ht 61.0 in | Wt 175.2 lb

## 2022-08-05 DIAGNOSIS — G44029 Chronic cluster headache, not intractable: Secondary | ICD-10-CM | POA: Diagnosis not present

## 2022-08-05 DIAGNOSIS — R519 Headache, unspecified: Secondary | ICD-10-CM | POA: Diagnosis not present

## 2022-08-05 MED ORDER — EMGALITY 120 MG/ML ~~LOC~~ SOAJ: 1.0000 | SUBCUTANEOUS | 5 refills | Status: DC

## 2022-08-05 MED ORDER — CYCLOBENZAPRINE HCL 5 MG PO TABS: 5.0000 mg | ORAL_TABLET | Freq: Three times a day (TID) | ORAL | 2 refills | Status: DC | PRN

## 2022-08-05 MED ORDER — NURTEC 75 MG PO TBDP: 1.0000 | ORAL_TABLET | Freq: Once | ORAL | 0 refills | Status: AC

## 2022-08-05 NOTE — Assessment & Plan Note (Addendum)
chronic, Unstable increased the Propranolol to 20mg  bid  (helped w/heart rate but not frequency of HA) and increased Flexeril 2 pills w/1 Naproxen for acute headache still having same number of HA each week, and increased Flexeril/Naproxen helps get over faster but still comes back provided Nurtec 75mg  x2 doses sample pack today, and sending Emgality injectable RX, advised on use & SE of both, also sending PA try the Nurtec alone first, if not helping after 2 hours, then ok to take the Flexeril/Naproxen f/u 2 mos or prn

## 2022-08-05 NOTE — Progress Notes (Signed)
Patient ID: Amber Watts, female    DOB: 08-03-2000, 22 y.o.   MRN: [id number]  Chief Complaint  Patient presents with   Headache    Follow up, headaches are still the same. Pt states she has finished all medications which did help for a short amount of time but headaches would return.    HPI: Persistent headaches:  HX:  previously reported having headaches at least 2 per week, had more severe migraines 4 years ago.  Denies any known food allergies, but does feel bad after eating pork. Allergy panel negative. Increased Propranolol to 20mg  bid last visit as she also was having a rapid heart beat with just standing or walking and sometimes felt lightheaded, also, HA increased to 3/week. Still feeling HA 3x/week, only 1 x per week she describes as a migraine &  will have to lay down to get pain under control.   Assessment & Plan:   Problem List Items Addressed This Visit       Nervous and Auditory   Chronic cluster headache, not intractable - Primary    chronic, Unstable increased the Propranolol to 20mg  bid  (helped w/heart rate but not frequency of HA) and increased Flexeril 2 pills w/1 Naproxen for acute headache still having same number of HA each week, and increased Flexeril/Naproxen helps get over faster but still comes back provided Nurtec 75mg  x2 doses sample pack today, and sending Emgality injectable RX, advised on use & SE of both, also sending PA try the Nurtec alone first, if not helping after 2 hours, then ok to take the Flexeril/Naproxen f/u 2 mos or prn      Relevant Medications   Rimegepant Sulfate (NURTEC) 75 MG TBDP   Galcanezumab-gnlm (EMGALITY) 120 MG/ML SOAJ   cyclobenzaprine (FLEXERIL) 5 MG tablet     Other   RESOLVED: Persistent headaches   Relevant Medications   Rimegepant Sulfate (NURTEC) 75 MG TBDP   Galcanezumab-gnlm (EMGALITY) 120 MG/ML SOAJ   cyclobenzaprine (FLEXERIL) 5 MG tablet    Subjective:    Outpatient Medications Prior to Visit   Medication Sig Dispense Refill   Acetylcysteine 600 MG CAPS Take 1 capsule (600 mg total) by mouth 2 (two) times daily.  0   b complex vitamins capsule Take 1 capsule by mouth daily.     clindamycin (CLEOCIN T) 1 % lotion      DULoxetine (CYMBALTA) 60 MG capsule Take 2 capsules (120 mg total) by mouth daily. 180 capsule 0   ISOtretinoin (ACCUTANE) 30 MG capsule Take 30 mg by mouth daily.     lisdexamfetamine (VYVANSE) 40 MG capsule Take 1 capsule (40 mg total) by mouth every morning. 30 capsule 0   mupirocin ointment (BACTROBAN) 2 % 3 (three) times daily.     naproxen (NAPROSYN) 500 MG tablet Take 1 tablet (500 mg total) by mouth 2 (two) times daily with a meal. Take 1 tab at first sign of headache along with the Cyclobenzaprine (Flexeril) 15 tablet 2   propranolol (INDERAL) 10 MG tablet Take 2 tablets (20 mg total) by mouth 2 (two) times daily. For headache prevention. OK to increase to 3 times daily if needed. 120 tablet 2   sertraline (ZOLOFT) 25 MG tablet BEGIN 5 DAYS BEFORE MENSES BEGINS AND DAYS 1 AND 2 OF CYCLE, THEN STOP 90 tablet 1   traZODone (DESYREL) 100 MG tablet TAKE 1/2 TO 1 TABLET (50-100 MG TOTAL) BY MOUTH AT BEDTIME AS NEEDED FOR SLEEP 90 tablet 3   cyclobenzaprine (  FLEXERIL) 5 MG tablet Take 1-2 tablets (5-10 mg total) by mouth 3 (three) times daily as needed for muscle spasms (Headaches). 30 tablet 0   No facility-administered medications prior to visit.   Past Medical History:  Diagnosis Date   ADHD (attention deficit hyperactivity disorder)    Anxiety    Depression    Persistent headaches 01/01/2022   Past Surgical History:  Procedure Laterality Date   FOOT SURGERY     No Known Allergies    Objective:    Physical Exam Vitals and nursing note reviewed.  Constitutional:      Appearance: Normal appearance.  Cardiovascular:     Rate and Rhythm: Normal rate and regular rhythm.  Pulmonary:     Effort: Pulmonary effort is normal.     Breath sounds: Normal  breath sounds.  Musculoskeletal:        General: Normal range of motion.  Skin:    General: Skin is warm and dry.  Neurological:     Mental Status: She is alert.  Psychiatric:        Mood and Affect: Mood normal.        Behavior: Behavior normal.    BP 119/75 (BP Location: Left Arm, Patient Position: Sitting, Cuff Size: Large)   Pulse 73   Temp (!) 97.5 F (36.4 C) (Temporal)   Ht 5\' 1"  (1.549 m)   Wt 175 lb 3.2 oz (79.5 kg)   LMP 07/17/2022 (Exact Date)   SpO2 98%   BMI 33.10 kg/m  Wt Readings from Last 3 Encounters:  08/05/22 175 lb 3.2 oz (79.5 kg)  05/02/22 173 lb (78.5 kg) (93 %, Z= 1.45)*  01/31/22 172 lb 4 oz (78.1 kg) (93 %, Z= 1.44)*   * Growth percentiles are based on CDC (Girls, 2-20 Years) data.      Jeanie Sewer, NP

## 2022-08-05 NOTE — Assessment & Plan Note (Deleted)
chronic, Unstable increased the Propranolol to 20mg  bid  (helped w/heart rate but not frequency of HA) and increased Flexeril 2 pills w/1 Naproxen for acute headache still having same number of HA each week, and increased Flexeril helps get over faster but still comes back provided Nurtec sample today, and sending Emgality injectable RX, advised on use & SE of both. try the Nurtec alone first, if not helping after 2 hours, then ok to take the Flexeril/Naproxen f/u 2 mos or prn

## 2022-08-05 NOTE — Patient Instructions (Addendum)
It was very nice to see you today!   I gave you Nurtec sample today to use when you first feel a headache/migraine coming on. 1 pill in 24 hours. If this doesn't help after 2 hours, ok to take the Flexeril and Naproxen. If the Nurtec does help better let me know and I will send in a prescription.   I also am sending over the monthly injectable med we talked about, Emgality, to help prevent the headaches.  Schedule a 2 month follow up visit.       PLEASE NOTE:  If you had any lab tests please let us know if you have not heard back within a few days. You may see your results on MyChart before we have a chance to review them but we will give you a call once they are reviewed by [place]. If we ordered any referrals today, please let us know if you have not heard from their office within the next week.

## 2022-08-07 ENCOUNTER — Telehealth: Payer: Self-pay | Admitting: Family

## 2022-08-07 DIAGNOSIS — G44029 Chronic cluster headache, not intractable: Secondary | ICD-10-CM

## 2022-08-07 NOTE — Telephone Encounter (Signed)
Patient states PCP informed her to let her know if nurtec 75 mg helps. Patient states she has found relief and would like this to be prescribed as discussed with PCP at 02/06 OV.

## 2022-08-08 ENCOUNTER — Other Ambulatory Visit: Payer: Self-pay | Admitting: Physician Assistant

## 2022-08-08 ENCOUNTER — Telehealth: Payer: Self-pay

## 2022-08-08 NOTE — Telephone Encounter (Signed)
-----   Message from Jeanie Sewer, NP sent at 08/05/2022  7:44 PM EST ----- Regarding: PA Please start PA for [name] Emgality - failed Propranolol, Flexeril, Naproxen.

## 2022-08-08 NOTE — Telephone Encounter (Signed)
I called pt in regards to [facility], I Let pt know the pharmacist at [facility] on [address] has ordered the RX and does have it in stock for her. Pt gave a verbalized understanding. No PA needed at this time.

## 2022-08-12 MED ORDER — NURTEC 75 MG PO TBDP: 1.0000 | ORAL_TABLET | ORAL | 2 refills | Status: AC

## 2022-08-12 NOTE — Telephone Encounter (Signed)
RX sent to [facility] on [address]

## 2022-08-18 ENCOUNTER — Other Ambulatory Visit: Payer: Self-pay

## 2022-08-18 ENCOUNTER — Telehealth: Payer: Self-pay | Admitting: Physician Assistant

## 2022-08-18 NOTE — Telephone Encounter (Signed)
Pended.

## 2022-08-18 NOTE — Telephone Encounter (Signed)
Pt called and needs a refill on her vyvanse 40 mg. Pharmacy is [facility] on [address]. They do have it in stock

## 2022-08-19 ENCOUNTER — Other Ambulatory Visit: Payer: Self-pay

## 2022-08-19 MED ORDER — LISDEXAMFETAMINE DIMESYLATE 40 MG PO CAPS: 40.0000 mg | ORAL_CAPSULE | ORAL | 0 refills | Status: DC

## 2022-08-22 ENCOUNTER — Ambulatory Visit: Payer: BC Managed Care – PPO | Admitting: Physician Assistant

## 2022-08-22 ENCOUNTER — Encounter: Payer: Self-pay | Admitting: Physician Assistant

## 2022-08-22 DIAGNOSIS — F3341 Major depressive disorder, recurrent, in partial remission: Secondary | ICD-10-CM

## 2022-08-22 DIAGNOSIS — F3281 Premenstrual dysphoric disorder: Secondary | ICD-10-CM

## 2022-08-22 DIAGNOSIS — G4700 Insomnia, unspecified: Secondary | ICD-10-CM

## 2022-08-22 DIAGNOSIS — F900 Attention-deficit hyperactivity disorder, predominantly inattentive type: Secondary | ICD-10-CM | POA: Diagnosis not present

## 2022-08-22 MED ORDER — DULOXETINE HCL 60 MG PO CPEP: 120.0000 mg | ORAL_CAPSULE | Freq: Every day | ORAL | 3 refills | Status: DC

## 2022-08-22 MED ORDER — AMPHETAMINE-DEXTROAMPHETAMINE 10 MG PO TABS: 10.0000 mg | ORAL_TABLET | Freq: Every day | ORAL | 0 refills | Status: DC

## 2022-08-22 MED ORDER — TRAZODONE HCL 100 MG PO TABS: 100.0000 mg | ORAL_TABLET | Freq: Every evening | ORAL | 3 refills | Status: DC | PRN

## 2022-08-22 NOTE — Progress Notes (Signed)
[facility]  Patient ID: Amber Watts,  MRN: [id number]  PCP: Jeanie Sewer, NP  Date of Evaluation: 08/22/2022 Time spent:20 minutes  Chief Complaint:  Chief Complaint   Depression; ADD; Insomnia; Follow-up    HISTORY/CURRENT STATUS: HPI For routine med check  We increased Cymbalta at the last visit.  She is feeling better as far as her mood goes. Patient is able to enjoy things.  Energy and motivation are good.  School is going ok.  No extreme sadness, tearfulness, or feelings of hopelessness.  Sleeps fairly well w/ the Trazodone, but it's not working as well as it did.  ADLs and personal hygiene are normal.   Weight is stable.  No c/o extreme anxiety.  Zoloft is helping PMS.  Denies suicidal or homicidal thoughts.  She was finally able to fill the Vyvanse.  She has not noticed any improvement in focus or concentration.  She still gets distracted very easily.  Patient denies increased energy with decreased need for sleep, increased talkativeness, racing thoughts, impulsivity or risky behaviors, increased spending, increased libido, grandiosity, increased irritability or anger, paranoia, or hallucinations.  Denies dizziness, syncope, seizures, numbness, tingling, tremor, tics, unsteady gait, slurred speech, confusion. Denies muscle or joint pain, stiffness, or dystonia.  Individual Medical History/ Review of Systems: Changes? :Yes diagnosed with cluster headaches and started on Emgality.  Propranolol was discontinued.  Past medications for mental health diagnoses include: Prozac, Lexapro, Focalin XR caused tremor in hands, Wellbutrin was ineffective, Hydroxyzine was ineffective for sleep, Buspar, Zoloft premenstrually  Allergies: Patient has no known allergies.  Current Medications:  Current Outpatient Medications:    Acetylcysteine 600 MG CAPS, Take 1 capsule (600 mg total) by mouth 2 (two) times daily., Disp: , Rfl: 0   amphetamine-dextroamphetamine  (ADDERALL) 10 MG tablet, Take 1 tablet (10 mg total) by mouth daily with lunch., Disp: 30 tablet, Rfl: 0   b complex vitamins capsule, Take 1 capsule by mouth daily., Disp: , Rfl:    clindamycin (CLEOCIN T) 1 % lotion, , Disp: , Rfl:    cyclobenzaprine (FLEXERIL) 5 MG tablet, Take 1-2 tablets (5-10 mg total) by mouth 3 (three) times daily as needed for muscle spasms (Headaches)., Disp: 30 tablet, Rfl: 2   Galcanezumab-gnlm (EMGALITY) 120 MG/ML SOAJ, Inject 1 each into the skin every 30 (thirty) days., Disp: 1.12 mL, Rfl: 5   ISOtretinoin (ACCUTANE) 30 MG capsule, Take 30 mg by mouth daily., Disp: , Rfl:    lisdexamfetamine (VYVANSE) 40 MG capsule, Take 1 capsule (40 mg total) by mouth every morning., Disp: 30 capsule, Rfl: 0   mupirocin ointment (BACTROBAN) 2 %, 3 (three) times daily., Disp: , Rfl:    naproxen (NAPROSYN) 500 MG tablet, Take 1 tablet (500 mg total) by mouth 2 (two) times daily with a meal. Take 1 tab at first sign of headache along with the Cyclobenzaprine (Flexeril), Disp: 15 tablet, Rfl: 2   sertraline (ZOLOFT) 25 MG tablet, BEGIN 5 DAYS BEFORE MENSES BEGINS AND DAYS 1 AND 2 OF CYCLE, THEN STOP, Disp: 90 tablet, Rfl: 1   DULoxetine (CYMBALTA) 60 MG capsule, Take 2 capsules (120 mg total) by mouth daily., Disp: 180 capsule, Rfl: 3   propranolol (INDERAL) 10 MG tablet, Take 2 tablets (20 mg total) by mouth 2 (two) times daily. For headache prevention. OK to increase to 3 times daily if needed. (Patient not taking: Reported on 08/22/2022), Disp: 120 tablet, Rfl: 2   traZODone (DESYREL) 100 MG tablet, Take 1-2 tablets (100-200 mg  total) by mouth at bedtime as needed for sleep., Disp: 180 tablet, Rfl: 3 Medication Side Effects: none  Family Medical/ Social History: Changes? No  MENTAL HEALTH EXAM:  Last menstrual period 07/17/2022.There is no height or weight on file to calculate BMI.  General Appearance: Casual and Well Groomed  Eye Contact:  Good  Speech:  Clear and Coherent and  Normal Rate  Volume:  Normal  Mood:  Euthymic  Affect:  Congruent  Thought Process:  Goal Directed and Descriptions of Associations: Circumstantial  Orientation:  Full (Time, Place, and Person)  Thought Content: Logical   Suicidal Thoughts:  No  Homicidal Thoughts:  No  Memory:  WNL  Judgement:  Good  Insight:  Good  Psychomotor Activity:  Normal  Concentration:  Concentration: Fair and Attention Span: Fair  Recall:  Good  Fund of Knowledge: Good  Language: Good  Assets:  Communication Skills Desire for Improvement Financial Resources/Insurance Housing Transportation Vocational/Educational  ADL's:  Intact  Cognition: WNL  Prognosis:  Good   DIAGNOSES:    ICD-10-CM   1. Attention deficit hyperactivity disorder (ADHD), predominantly inattentive type  F90.0     2. 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 PMDD (premenstrual dysphoric disorder)  F32.81     4. Insomnia, unspecified type  G47.00       Receiving Psychotherapy: Yes    [name]  RECOMMENDATIONS:  PDMP was reviewed.  Last Vyvanse was filled 07/16/2022. I provided 20 minutes of face to face time during this encounter, including time spent before and after the visit in records review, medical decision making, counseling pertinent to today's visit, and charting.   The Zoloft is still working for PMDD so no change there. Cymbalta is helping with the depression so we will continue same dose. She just got the Vyvanse filled a few days ago, I recommend adding Adderall short-acting late morning or around lunchtime as needed.  She could bring the Vyvanse here for appropriate disposal but since she just got it filled we agree to adding on Adderall.  In approximately 3 weeks if she is not noticing any difference I will increase the dose.  She will need to call the office to let me know either way how the new treatment is working for the ADD.  Continue NAC 600 mg, 1 p.o. twice daily. Start  Adderall 10 mg, 1 p.o. daily, late morning or around lunch.  Can take as needed. Continue Cymbalta 60 mg, 2 p.o. daily. Continue Vyvanse 40 mg, 1 p.o. every morning. Continue Zoloft 25 mg p.o. daily beginning 5 days prior to menses and then days 1 and 2 of the cycle and then stop it. Increase trazodone 100 mg, to 1-2 p.o. nightly as needed sleep..  Continue multivitamin, B complex, and iron. Continue therapy with Evelena Peat Deussing. Return in 6-8 weeks.   Donnal Moat, PA-C

## 2022-09-08 ENCOUNTER — Ambulatory Visit: Payer: BC Managed Care – PPO | Admitting: Clinical

## 2022-09-09 ENCOUNTER — Ambulatory Visit (INDEPENDENT_AMBULATORY_CARE_PROVIDER_SITE_OTHER): Payer: BC Managed Care – PPO | Admitting: Clinical

## 2022-09-09 DIAGNOSIS — F989 Unspecified behavioral and emotional disorders with onset usually occurring in childhood and adolescence: Secondary | ICD-10-CM | POA: Diagnosis not present

## 2022-09-09 NOTE — Progress Notes (Signed)
Time: 9:00am-9:54am CPT Code: [id number] Diagnosis: F98.9  Amber Watts was seen remotely using secure video conferencing. She was in her dorm room at [facility] and the examiner was in her office at the time of the appointment. Following verbal review of consent forms, examiner provided an overview of the evaluation process and began a detailed intake interview with Amber Watts. She is scheduled to be seen with her mother present on 3/28 to complete a semii-structured developmental interview.  Reason for Referral Amber Watts was referred for an evaluation by her therapist, Lottie Mussel, after Amber Watts shared suspicion that she demonstrates characteristics of ASD. She reported that she took the RADS test online, and received an elevated score in the elevated range and higher than her friends.   Previous Diagnoses Amber Watts was diagnosed at 71 with generalized anxiety disorder by her psychiatrist at the time  ADHD at 41 by [facility] PMDD diagnosed at [facility]: Amber Watts's mother fell pregnant with her at 74 years of age. Pregnancy-bleeding during pregnancy and original due date was in February. Amber Watts was delivered via C-section. She had water in her lungs at birth. She was able to go home from the hospital within a typical timeframe. Amber Watts reported that she had colic within her first year of life. Amber Watts reported that she rolled her ankle at 7 and cut her foot on broken glass as a result, which resulted in having to have stitches. 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 She shared suspicion that it never fully went away, and continues to flare up when she gets very hot. When she was in middle school, she went to urgent care with fear that she was having a heart attack due to difficulty breathing, but learned that she was having a panic attack. She shared that she was often easily overwhelmed in middle and high school, resulting in frequent panic attacks. In  February of 2024, she was diagnosed with chronic cluster headaches. She reported that she has suffered headaches much of her life, but they worsened in summer of 2023. At intake, she was prescribed Mgalady injections for headaches, duloxetine, trazodone, Vyvanse, Adderall, certraline and acutine for PMDD. In the past, she has also been prescribed birth control pills. She reported having taken them for three years in the past to manage irregular periods. Following her initial ADHD diagnoses, she was prescribed Vyvanse and was unsure if she had tried other prescription medications. She has also previously been prescribed hydroxozine to manage sleep. Amber Watts reported that she had difficulty sleeping in childhood, and was expelled from preschool due to not napping. She reported that her parents were encouraged not to re-enroll her due to "defiance." She reported that she often woke up at night as a child, and experienced anxiety about waking up. She "never really slept through the whole night unless I was really really exhausted." Currently, she reported that "sometimes the trazodone works and sometimes it doesn't," and reported that she has a difficult time falling and staying asleep when it does not. She often wakes up multiple times per night, sometimes for as long as an hour. She shared that she has always been labeled as a very picky eater, including during her childhood. At intake, she shared that she eats the same things most days, and is reluctant to try new foods. She reported that she sometimes does not like even preferred foods when they are made differently. She shared that this causes stress and inhibits her from  eating out. She shared that she has always been described as a very picky eater.   Family History At intake, she lived in the dorms at [employer] [place], where she was attending college. She shared that she still sees her parents, who live in [place], often. She also reported that she has a 32 year old  sister who lives with her parents due to difficulty maintaining employment. She was born in [place]. She was cared for in the home by her mother following delivery. CHECK IN ABOUT DAYCARE? CHILDCARE ARRANGEMENTS? She began attending daycare between the ages of 5-4, before starting kindergarten at [employer]. In first grade, she attended [employer] for roughly one month. However, she reported that she "hated it" and cried daily, until she was transferred back to [place], where she continued through 5th grade. She was able to complete elementary school without an IEP or 504 plan, and reported no significant behavioral or academic concerns during this period. She has never had an IEP or 504 plan.  She began attending middle school at [employer] in 6th grade. She reported that this was "awful," but she continued until halfway through 9th grade. Amber Watts reported that she was very emotionally sensitive during this period, and often cried. Educators often responded by telling her she had no reason to cry. She also struggled with inconsistency in academics between middle and elementary school instruction. She reported that she also struggled socially, but was able to make friends "with the other people who didn't fit in." She transferred to [employer] halfway through 9th grade. She described the second half of 9th grade as especially challenging due to culture shock. She noted that she had already been struggling with math, and was transferred into a math class and particularly struggled with her new teacher's approach. She nonetheless was able to make friends during the remainder of 9th grade. COVID19 began halfway through her 10th grade year. She attended 11th grade partially remotely and partially in person. She reported that she found typically challenging subjects especially challenging to learn remotely, and often "shut down" emotionally. In 12th  grade, she reported that she had a very good psychology teacher who helped her when she shut down. She enrolled at [employer] after graduating high school in 2022. 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 She reported that college had been "a lot." She shared that she has experienced symptoms of what sounded to her like "autistic burnout" since starting college. She has struggled to keep up with classes. She has considered cutting back on her courseload, but has to be enrolled full time in school in order to stay on her parents' health insurance plan.   Family history is significant for depression, ADHD, anxiety.  General Behavior: WNL Attire: WNL Gait: Not observed-telehealth Motor Activity: frequent slight body rocking Stream of Thought - Productivity: WNL Stream of thought - Progression: WNL Stream of thought - Language:  WNL Emotional tone and reactions - Mood: WNL  Emotional tone and reactions - Affect: flat Mental trend/Content of thoughts - Perception: WNL  Mental trend/Content of thoughts - Orientation: WNL Mental trend/Content of thoughts - Memory: WNL Mental trend/Content of thoughts - General knowledge: WNL  Insight: WNL Judgment: WNL Intelligence: WNL Mental Status Comment: WNL                Myrtie Cruise, PhD

## 2022-09-25 ENCOUNTER — Ambulatory Visit (INDEPENDENT_AMBULATORY_CARE_PROVIDER_SITE_OTHER): Payer: BC Managed Care – PPO | Admitting: Clinical

## 2022-09-25 DIAGNOSIS — F989 Unspecified behavioral and emotional disorders with onset usually occurring in childhood and adolescence: Secondary | ICD-10-CM

## 2022-09-25 NOTE — Progress Notes (Signed)
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 She is scheduled to be seen in person for cognitive and behavioral testing in one week.  Developmental and Behavioral History Early Concerns and Developmental Milestones Looking back with hindsight, when do you think _____ first showed any problems or difficulties in development or behavior? Do you think everything was alright before then?  [name]'s mother reported that she has always more mature than similarly aged children. 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 Others have frequently commented that Terri Piedra has "an old soul." She noted no concerns during [name]'s toddlerhood or very early childhood, but did comment that "she did not act like a normal child," and often did not want to do things that similarly aged peers commonly did. At the time, this was attributed to Mitchell having significantly older siblings.   When did ____first start walking unaided? (This is looking for a delay vis a vis normative milestones)  Terri Piedra was reported to have started walking shortly before her first birthday.  How did toilet training go? At what point did ____ become continent during the day? At what point did he/she gain full control of his bowels?  Terri Piedra was reported to have spontaneously decided to start wearing underwear and no longer needed diapers after about 24 months of age.    How old was ____ when he/she first used words meaningfully, apart from "mama" and "dada?"  [name]'s mother could not recall when she first started using single words, but no speech delay has ever been suspected and all speech milestones were met within normal limits.  How old was he/she when they first said something  meaningfully that involved putting words together?   She was reported to have met all language milestones within a typical timeframe.   Have you ever been concerned that _____ lost skills early during his/her first years of life, and not regained them for a period of 3 months or more?  No skill regressions were reported.    Social Affect  Can you have a conversation with ____? What does he/she do if you say something to him/her, without directly asking a question?  [name]'s mother reported that she is able to engage in reciprocal conversation. However, she and [name] reported that she repsonds inconsistently to non-question statements. [name] noted that she often feels as though she does not know what to say. [name] and her mother shared that she is "not good at small talk." [name]'s mother reported that she was able to engage in reciprocal conversation when she was between the ages of 33-5.   How is ____'s eye contact? How was his/her eye contact when he/she was between the ages of 74-5? [name]'s mother reported that she does not do eye contact well. [name] shared that she does not like making eye contact, and added "it just feels kind of weird, like we're staring each other down." [name]'s mother noted no issues with her eye contact when she was between the ages of 85-5.   Are there times when _____ uses socially inappropriate questions or statements?  [name] shared an example of responding to receiving a bag of candy for easter by saying, "you can have those, but thank you," which was offensive to family members. Her mother  also shared an example of [name] giving a gift back when she did not like when she was between the ages of 5-6. [name] noted that, even at present, she does not understand how this might be perceived as hurtful.  Has _______ ever gotten his personal pronouns the wrong way around? For example, mixing up "you" and "I" or "he" and "she?"  No pronoun confusion was reported  currently or in the past.   Does ______ every spontaneously point to express interest? What about at 69-55 years old?  [name] and her mother reported that she does not currently point to express interest or make requests. Her mother reported that she did point to express interest and request in early childhood, but could not recall if she did so with eye contact.  Does ______ ever nod or shake his/her head communicatively?  [name] and her mother reported that she nods her head to mean yes and shakes her head to mean no at present. She also did so when she was between the ages of 37-5.   What is his/her use of gestures like? What about at age 43-5?  [name] and her mother reported that she uses a range of gestures to communicate at present. She also did so when she was between the ages of 42-5.  From age 60-5, did ______ play any pretend games? What about with others? Does he/she do so currently?  [name]'s mother reported that she engaged in pretend play when she was between the ages of 75-5. Her mother noted that she had an imaginary friend named "baa baa" that she pretended so consistently that others became concerned that she may see her. Her mother described her descriptions of baa baa as so vivid that others believed she was real. When Terri Piedra was between the ages of 62-6, 40 Baa "went to the dentist and never came back." Terri Piedra was reported to also engage in play that involved the use of dolls and figures as agents. Her mother noted that play was highly imaginative and "conversations were intense and real." She noted that the plot changed from day-to-day. At times, however, Terri Piedra acted out commercials she had seen with her dolls.    Does ______ every show you things that interest him/her? What about aged 4-5? Terri Piedra was reported to show items of interest at present. Her mother noted that she is especially likely to share content related to interests in anime, funco pops, and comic books. She has  items arranged in her room, and enjoys showing them to others by taking them on "tours." At present, she typically shows items related to specific interests. She was also reported to have shown a range of items when she was between the ages of 75-5.  Does ____ have any particular friends or a best friend? What about between the ages of 64-5? When she was between the ages of 33-15, she was reported to have had reciprocal friendships centered on a variety of interests. When she was between the ages of 59-5, she was reported to have had friends whose birthday parties she attended.   When she was between the ages of 32-5 and in the presence of similarly aged peers she did not know, [name] reported that she didn't "Venture out," and tended to stay with activities and people who were familiar. She was reported to have engaged in parallel play but rarely approached them to engage directly.   Restricted and Repetitive Behaviors Has ______ ever tended to use rather odd phrases  or same the same thing over and over again in almost exactly the same way? Either phrases he/she has heard others use or made up him/herself.   [name] reported that she repeats phrases from [place] to her dog because she enjoys the sound of it. She also shared that, while in high school and driving to school, she heard the word "perplexed" in a song she began using it when she felt it would fit in the context. [name]'s mother reported that she repeated certain words and phrases, including taglines from commercials, in early childhood.    Has ______ ever used words that seem to have been invented or made up? Does _____ ever put things in odd or indirect ways, or have idiosyncratic ways of saying things (e.g., "hot rain" for "steam"). No neologisms were reported currently or in the past. [name] reported that she sometimes combines words (e.g., using confused and puzzled to say "confuzzled").     Does ______ every say the same things over and  over in exactly the same way, or insist on you saying the same things over and over again?  No verbal or behavioral rituals were reported currently or in the past.   [name]'s mother reported that she does not like when food touches on her plate, and does not eat food that has touched.     Is there anything unusual about the way ____ speaks? (Intonation, rate, rhythm, volume.)  [name] reported that she has been told by one of her closest friends that she speaks with a very monotone intonation. 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 She also reported that she has also been asked to quiet down and/or speak up when she thinks she is speaking at a normal volume. [name]'s mother commented "at home, she's loud all the time."   [name] demonstrated her customer service voice, and how she has used adjust her intonation to be less monotone and speak at a higher pitch. She has observed that "people do not respond well" if she speaks with her natural intonation because she sounds bored.  Does _____ have any unusual interests that preoccupy him even when not physically present and may seem odd to others (e.g., toilets flushing, metal objects, street signs.)  [name]'s mother shared that, as a young child, she demonstrated a preoccupation with bags. Her sisters referred to her as "the bag lady," because she walked from place to place with 3-4 [employer] bags, "full of who knows what." At present, she especially enjoys tote bags, and purchases tote bags and resuable bags them when the opportunity presents. Her mother noted that "wherever we go, she buys a reusable bag."  [name] shared that, when she was in elementary school, she had an "obsession with Gwynne Edinger," and she visited his house for her birthday. Her next interest was World War II, followed by [place], and [place]. She shared that these interests are all consuming. Her mother noted that, "her  interests were always a little more intense," and similarly aged peers often appeared bored while [name] talked at length about preferred interests. Human trafficking has also been an intense interest in the past. Her mother noted that [name]'s intense interests have tended to center on social justice issues.  Does ___ have any special hobbies or interests that are unusual in their intensity?  Does ______ play with the whole toy or seem more interested in parts of the toy (for example, spinning the wheels  repetitively on a toy car versus pretending to drive it)  [name] reported that she likes to organize items. She frequently pretended she was a Pharmacist, hospital or going on a trip, and play centered on setting up her classroom or packing her bags. [name]'s mother reported that she also enjoyed planning the itinerary of family trips.   Does____ seem unusually sensitive to or interested in textures, smells, or noises?  [name] reported that, at present, she is bothered by certain sounds, including the sound of electricity, or when people are speaking loudly in [employer], and babies crying. [name] shared that she has a range of sensory aversions. She does not like when food is wet, getting grease on her hands, unexpected mouth textures and tastes of foods. She also reported that she does not like wearing jeans with a firm waistband. She does not like being hot, or the feeling of certain sweaters. She does not like the texture of the roof of a car. She shared that bright lights hurt her eyes. In terms of sensory interests, she reported that she has a weighted blanket. She likes the feeling of fuzzy stuffed animals, and the fur on dogs. No visual inspection was reported.   How does _____ do with changes in routine?  [name] shared that she enjoys having a routine, and becomes frustrated when she can't stay on one. However, she sometimes struggles to maintain routine, which she attributed to her ADHD diagnosis. Her  mother shared that she did not like changes in routine as a child. This was not to a degree that caused meltdowns or resulted in the family adjusting their behavior to avoid upsetting [name]. [name] described feeling very upset by changes but that she went along with what was asked.    Has _____ every had any usual movements of hand or fingers, such as flicking fingers in front of his or her eyes or flapping hands when excited or upset?  In terms of hand mannerisms, [name] reported that she pops her fingers and wrings her hands in the midline, as rolls her hands at the wrist. Her mother could not recall whether she did this as a young child.                Myrtie Cruise, PhD

## 2022-09-29 ENCOUNTER — Other Ambulatory Visit: Payer: Self-pay | Admitting: Physician Assistant

## 2022-10-03 ENCOUNTER — Ambulatory Visit: Payer: BC Managed Care – PPO | Admitting: Clinical

## 2022-10-03 DIAGNOSIS — F989 Unspecified behavioral and emotional disorders with onset usually occurring in childhood and adolescence: Secondary | ICD-10-CM

## 2022-10-03 NOTE — Progress Notes (Signed)
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 She completed the SB-5 (2 hours, 96132 P 1 unit, 96133P 1 unit), followed by the ADOS-2, Module 4 (1 hour, 96133P 1 unit). The examiner then scored completed test materials (1 hour, 96133P 1 unit), and began integrating results into a detailed evaluation report (1 hour, 96133P 1 unit). Feedback is scheduled for 5/17, pending receipt of remaining completed parent forms.                Chrissie Noa, PhD

## 2022-10-06 ENCOUNTER — Encounter: Payer: Self-pay | Admitting: Family

## 2022-10-06 ENCOUNTER — Ambulatory Visit: Payer: BC Managed Care – PPO | Admitting: Family

## 2022-10-06 VITALS — BP 116/78 | HR 83 | Temp 98.2°F | Ht 61.0 in | Wt 175.0 lb

## 2022-10-06 DIAGNOSIS — H539 Unspecified visual disturbance: Secondary | ICD-10-CM | POA: Diagnosis not present

## 2022-10-06 DIAGNOSIS — G44029 Chronic cluster headache, not intractable: Secondary | ICD-10-CM | POA: Diagnosis not present

## 2022-10-06 MED ORDER — PROPRANOLOL HCL 10 MG PO TABS: 20.0000 mg | ORAL_TABLET | Freq: Two times a day (BID) | ORAL | 2 refills | Status: DC

## 2022-10-06 NOTE — Assessment & Plan Note (Addendum)
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

## 2022-10-06 NOTE — Progress Notes (Signed)
Patient ID: Amber Watts, female    DOB: 07-Oct-2000, 22 y.o.   MRN: [id number]  Chief Complaint  Patient presents with   Headache    Follow up, Pt states her headaches have been better.   Blurred Vision    w/dizziness, nausea, has had for years    HPI: Persistent headaches:  HX:  previously reported having headaches at least 2 per week, had more severe migraines 4 years ago.  Denies any known food allergies, but does feel bad after eating pork. Allergy panel negative. Increased Propranolol to 20mg  bid last visit as she also was having a rapid heart beat with just standing or walking and sometimes felt lightheaded, also, HA increased to 3/week. Still feeling HA 3x/week, only 1 x per week she describes as a migraine &  will have to lay down to get pain under control.   Blurred vision w/other sx: has not mentioned these sx in previous visits, states she was unsure how to verbalize what was happening to her. States sx have occurred for about 4-5 years prior to starting any of her medications. She reports having vision changes, blurry and almost completely goes black sometimes, lasting for a few seconds & happens 4-5 times per day, about 2 times per week. States sx occur sometimes w/HA or migraine, sometimes w/o, also can feel nausea and dizziness w/the vision changes.   Assessment & Plan:  Chronic cluster headache, not intractable Assessment & Plan: chronic taking Emgality monthly which has helped reduce her headaches, current copay is high d/t pharmacy not accepting discount cards d/t nationwide computer virus not accepting the cards in system was taking Propranolol to 20mg  bid (helped w/heart rate but not frequency of HA) taking Flexeril 20mg  w/1 Naproxen for acute headache ran out of Propranolol 2 mos ago, but states her headaches are much better without it, having about 2-3 per month now  today pt states she has vision changes w/dizziness, nausea,  and has had for years prior to taking  all of her meds which can be r/t HA, but says sometimes occur w/o HA sending referral to Neuro, prefer their advice and f/u f/u 3 mos or prn  Orders: -     Propranolol HCl; Take 2 tablets (20 mg total) by mouth 2 (two) times daily. For headache prevention. OK to increase to 3 times daily if needed.  Dispense: 120 tablet; Refill: 2 -     Ambulatory referral to Neurology  Vision changes - describes as blurriness that sometimes goes to black lasting a few seconds, she states this is syncope, however denies loss of consciousness/fainting. sending referral to Neuro. -     Ambulatory referral to Neurology  Subjective:    Outpatient Medications Prior to Visit  Medication Sig Dispense Refill   Acetylcysteine 600 MG CAPS Take 1 capsule (600 mg total) by mouth 2 (two) times daily.  0   amphetamine-dextroamphetamine (ADDERALL) 10 MG tablet Take 1 tablet (10 mg total) by mouth daily with lunch. 30 tablet 0   b complex vitamins capsule Take 1 capsule by mouth daily.     cyclobenzaprine (FLEXERIL) 5 MG tablet Take 1-2 tablets (5-10 mg total) by mouth 3 (three) times daily as needed for muscle spasms (Headaches). 30 tablet 2   DULoxetine (CYMBALTA) 60 MG capsule Take 2 capsules (120 mg total) by mouth daily. 180 capsule 3   Galcanezumab-gnlm (EMGALITY) 120 MG/ML SOAJ Inject 1 each into the skin every 30 (thirty) days. 1.12 mL 5   lisdexamfetamine (  VYVANSE) 40 MG capsule Take 1 capsule (40 mg total) by mouth every morning. 30 capsule 0   naproxen (NAPROSYN) 500 MG tablet Take 1 tablet (500 mg total) by mouth 2 (two) times daily with a meal. Take 1 tab at first sign of headache along with the Cyclobenzaprine (Flexeril) 15 tablet 2   sertraline (ZOLOFT) 25 MG tablet BEGIN 5 DAYS BEFORE MENSES BEGINS AND DAYS 1 AND 2 OF CYCLE, THEN STOP 90 tablet 1   traZODone (DESYREL) 100 MG tablet Take 1-2 tablets (100-200 mg total) by mouth at bedtime as needed for sleep. 180 tablet 3   clindamycin (CLEOCIN T) 1 % lotion       ISOtretinoin (ACCUTANE) 30 MG capsule Take 30 mg by mouth daily.     mupirocin ointment (BACTROBAN) 2 % 3 (three) times daily.     propranolol (INDERAL) 10 MG tablet Take 2 tablets (20 mg total) by mouth 2 (two) times daily. For headache prevention. OK to increase to 3 times daily if needed. 120 tablet 2   No facility-administered medications prior to visit.   Past Medical History:  Diagnosis Date   ADHD (attention deficit hyperactivity disorder)    Anxiety    Depression    Persistent headaches 01/01/2022   Past Surgical History:  Procedure Laterality Date   FOOT SURGERY     No Known Allergies    Objective:    Physical Exam Vitals and nursing note reviewed.  Constitutional:      Appearance: Normal appearance. She is obese.  Cardiovascular:     Rate and Rhythm: Normal rate and regular rhythm.  Pulmonary:     Effort: Pulmonary effort is normal.     Breath sounds: Normal breath sounds.  Musculoskeletal:        General: Normal range of motion.  Skin:    General: Skin is warm and dry.  Neurological:     Mental Status: She is alert.  Psychiatric:        Mood and Affect: Mood normal.        Behavior: Behavior normal.    BP 116/78 (BP Location: Left Arm, Patient Position: Sitting, Cuff Size: Normal)   Pulse 83   Temp 98.2 F (36.8 C) (Temporal)   Ht 5\' 1"  (1.549 m)   Wt 175 lb (79.4 kg)   LMP 09/15/2022 (Exact Date)   SpO2 99%   BMI 33.07 kg/m  Wt Readings from Last 3 Encounters:  10/06/22 175 lb (79.4 kg)  08/05/22 175 lb 3.2 oz (79.5 kg)  05/02/22 173 lb (78.5 kg) (93 %, Z= 1.45)*   * Growth percentiles are based on CDC (Girls, 2-20 Years) data.      Dulce Sellar, NP

## 2022-10-07 ENCOUNTER — Ambulatory Visit: Payer: BC Managed Care – PPO | Admitting: Physician Assistant

## 2022-10-07 ENCOUNTER — Encounter: Payer: Self-pay | Admitting: Physician Assistant

## 2022-10-07 ENCOUNTER — Telehealth: Payer: Self-pay | Admitting: Physician Assistant

## 2022-10-07 ENCOUNTER — Other Ambulatory Visit: Payer: Self-pay

## 2022-10-07 DIAGNOSIS — F900 Attention-deficit hyperactivity disorder, predominantly inattentive type: Secondary | ICD-10-CM

## 2022-10-07 DIAGNOSIS — F3341 Major depressive disorder, recurrent, in partial remission: Secondary | ICD-10-CM | POA: Diagnosis not present

## 2022-10-07 DIAGNOSIS — G4700 Insomnia, unspecified: Secondary | ICD-10-CM | POA: Diagnosis not present

## 2022-10-07 DIAGNOSIS — F3281 Premenstrual dysphoric disorder: Secondary | ICD-10-CM

## 2022-10-07 MED ORDER — JORNAY PM 20 MG PO CP24: 20.0000 | ORAL_CAPSULE | Freq: Every day | ORAL | 0 refills | Status: DC

## 2022-10-07 MED ORDER — JORNAY PM 20 MG PO CP24: 20.0000 mg | ORAL_CAPSULE | Freq: Every day | ORAL | 0 refills | Status: DC

## 2022-10-07 NOTE — Progress Notes (Signed)
[facility] Med Check  Patient ID: Amber Watts,  MRN: [id number]  PCP: Dulce SellarHudnell, Stephanie, NP  Date of Evaluation: 10/07/2022 Time spent:20 minutes  Chief Complaint:  Chief Complaint   Depression; ADD; Insomnia; Follow-up    HISTORY/CURRENT STATUS: HPI For routine med check  We increased Trazodone for sleep 6 weeks ago. It has helped. Feels rested when she gets up. Not as sleepy during the day.   Adderall was started last month. Hasn't helped w/ focus or attention. Still gets distracted easily. Grades in school are good. Vyvanse is difficult to get.   Patient is able to enjoy things.  Energy and motivation are good.  School is going well.   No extreme sadness, tearfulness, or feelings of hopelessness.  ADLs and personal hygiene are normal.  Appetite has not changed.  Weight is stable.  Denies laxative use, calorie restricting, or binging and purging.   Denies cutting or any form of self-harm.  No c/o anxiety.  PMDD sx are some better w/ Zoloft prior to menses.  Denies suicidal or homicidal thoughts.  Patient denies increased energy with decreased need for sleep, increased talkativeness, racing thoughts, impulsivity or risky behaviors, increased spending, increased libido, grandiosity, increased irritability or anger, paranoia, or hallucinations.  Denies dizziness, syncope, seizures, numbness, tingling, tremor, tics, unsteady gait, slurred speech, confusion. Denies muscle or joint pain, stiffness, or dystonia.  Individual Medical History/ Review of Systems: Changes? :No   Past medications for mental health diagnoses include: Prozac, Lexapro, Focalin XR caused tremor in hands, Wellbutrin was ineffective, Hydroxyzine was ineffective for sleep, Buspar, Zoloft premenstrually, Vyvanse, Adderall IR  Allergies: Patient has no known allergies.  Current Medications:  Current Outpatient Medications:    Acetylcysteine 600 MG CAPS, Take 1 capsule (600 mg total) by mouth 2 (two) times  daily., Disp: , Rfl: 0   b complex vitamins capsule, Take 1 capsule by mouth daily., Disp: , Rfl:    DULoxetine (CYMBALTA) 60 MG capsule, Take 2 capsules (120 mg total) by mouth daily., Disp: 180 capsule, Rfl: 3   Galcanezumab-gnlm (EMGALITY) 120 MG/ML SOAJ, Inject 1 each into the skin every 30 (thirty) days., Disp: 1.12 mL, Rfl: 5   naproxen (NAPROSYN) 500 MG tablet, Take 1 tablet (500 mg total) by mouth 2 (two) times daily with a meal. Take 1 tab at first sign of headache along with the Cyclobenzaprine (Flexeril), Disp: 15 tablet, Rfl: 2   propranolol (INDERAL) 10 MG tablet, Take 2 tablets (20 mg total) by mouth 2 (two) times daily. For headache prevention. OK to increase to 3 times daily if needed., Disp: 120 tablet, Rfl: 2   sertraline (ZOLOFT) 25 MG tablet, BEGIN 5 DAYS BEFORE MENSES BEGINS AND DAYS 1 AND 2 OF CYCLE, THEN STOP, Disp: 90 tablet, Rfl: 1   traZODone (DESYREL) 100 MG tablet, Take 1-2 tablets (100-200 mg total) by mouth at bedtime as needed for sleep., Disp: 180 tablet, Rfl: 3   cyclobenzaprine (FLEXERIL) 5 MG tablet, Take 1-2 tablets (5-10 mg total) by mouth 3 (three) times daily as needed for muscle spasms (Headaches)., Disp: 30 tablet, Rfl: 2   Methylphenidate HCl ER, PM, (JORNAY PM) 20 MG CP24, Take 1 capsule (20 mg total) by mouth at bedtime., Disp: 30 capsule, Rfl: 0 Medication Side Effects: none  Family Medical/ Social History: Changes? No  MENTAL HEALTH EXAM:  Last menstrual period 09/15/2022.There is no height or weight on file to calculate BMI.  General Appearance: Casual and Well Groomed  Eye Contact:  Good  Speech:  Clear and Coherent and Normal Rate  Volume:  Normal  Mood:  Euthymic  Affect:  Congruent  Thought Process:  Goal Directed and Descriptions of Associations: Circumstantial  Orientation:  Full (Time, Place, and Person)  Thought Content: Logical   Suicidal Thoughts:  No  Homicidal Thoughts:  No  Memory:  WNL  Judgement:  Good  Insight:  Good   Psychomotor Activity:  Normal  Concentration:  Concentration: Fair and Attention Span: Fair  Recall:  Good  Fund of Knowledge: Good  Language: Good  Assets:  Desire for Improvement Financial Resources/Insurance Housing Transportation Vocational/Educational  ADL's:  Intact  Cognition: WNL  Prognosis:  Good   DIAGNOSES:    ICD-10-CM   1. Attention deficit hyperactivity disorder (ADHD), predominantly inattentive type  F90.0     2. Recurrent major depression in partial remission  F33.41     3. PMDD (premenstrual dysphoric disorder)  F32.81     4. Insomnia, unspecified type  G47.00       Receiving Psychotherapy: Yes    [name]  RECOMMENDATIONS:  PDMP was reviewed.  Last Adderall was filled 08/22/2022. Vyvanse filled 08/21/2022  I provided 20 minutes of face to face time during this encounter, including time spent before and after the visit in records review, medical decision making, counseling pertinent to today's visit, and charting.   Discussed the ADHD sx and other treatment options. Recommend starting Jornay PM, explained to take it at bedtime, it will take effect in about 8 hours. She would like to try it.   Stop Vyvanse and Adderall.  Continue NAC 600 mg, 1 p.o. twice daily. Continue Cymbalta 60 mg, 2 p.o. daily. Start Jornay PM 20 mg, 1 qhs.  Continue Zoloft 25 mg p.o. daily beginning 5 days prior to menses and then days 1 and 2 of the cycle and then stop it. Continue  trazodone 100 mg, to 1-2 p.o. nightly as needed sleep..  Continue multivitamin, B complex, and iron. Continue therapy with Brayton CavesJessie Deussing. Return in 4-6 weeks.   Melony Overlyeresa Xee Hollman, PA-C

## 2022-10-07 NOTE — Telephone Encounter (Signed)
[facility] sent PA Request for Jornay PM 20mg  cap, take 20/HS,  see CMM

## 2022-10-09 ENCOUNTER — Encounter: Payer: Self-pay | Admitting: Neurology

## 2022-10-09 NOTE — Telephone Encounter (Signed)
Clarified  Rx for [place], will follow up if still needs a PA

## 2022-10-10 ENCOUNTER — Telehealth: Payer: Self-pay

## 2022-10-10 NOTE — Telephone Encounter (Signed)
Initiated a prior authorization through [facility] for Jornay 20 mg #30/30 with [employer], pending a response or any further questions.

## 2022-10-25 ENCOUNTER — Emergency Department: Payer: BC Managed Care – PPO

## 2022-10-25 ENCOUNTER — Emergency Department
Admission: EM | Admit: 2022-10-25 | Discharge: 2022-10-26 | Disposition: A | Discharge status: Home / Self Care | Payer: BC Managed Care – PPO | Attending: Emergency Medicine | Admitting: Emergency Medicine

## 2022-10-25 ENCOUNTER — Other Ambulatory Visit: Payer: Self-pay

## 2022-10-25 DIAGNOSIS — R197 Diarrhea, unspecified: Secondary | ICD-10-CM | POA: Diagnosis present

## 2022-10-25 DIAGNOSIS — M79604 Pain in right leg: Secondary | ICD-10-CM | POA: Insufficient documentation

## 2022-10-25 DIAGNOSIS — R1013 Epigastric pain: Secondary | ICD-10-CM | POA: Diagnosis not present

## 2022-10-25 DIAGNOSIS — E876 Hypokalemia: Secondary | ICD-10-CM | POA: Diagnosis not present

## 2022-10-25 DIAGNOSIS — E860 Dehydration: Secondary | ICD-10-CM | POA: Insufficient documentation

## 2022-10-25 DIAGNOSIS — R112 Nausea with vomiting, unspecified: Secondary | ICD-10-CM | POA: Insufficient documentation

## 2022-10-25 DIAGNOSIS — R519 Headache, unspecified: Secondary | ICD-10-CM | POA: Insufficient documentation

## 2022-10-25 LAB — COMPREHENSIVE METABOLIC PANEL
ALT: 15 U/L (ref 0–44)
AST: 23 U/L (ref 15–41)
Albumin: 4.2 g/dL (ref 3.5–5.0)
Alkaline Phosphatase: 61 U/L (ref 38–126)
Anion gap: 10 (ref 5–15)
BUN: 13 mg/dL (ref 6–20)
CO2: 22 mmol/L (ref 22–32)
Calcium: 8.9 mg/dL (ref 8.9–10.3)
Chloride: 104 mmol/L (ref 98–111)
Creatinine, Ser: 0.55 mg/dL (ref 0.44–1.00)
GFR, Estimated: >60 mL/min (ref >60)
Glucose, Bld: 118 mg/dL — ABNORMAL HIGH (ref 70–99)
Potassium: 3.2 mmol/L — ABNORMAL LOW (ref 3.5–5.1)
Sodium: 136 mmol/L (ref 135–145)
Total Bilirubin: 1.4 mg/dL — ABNORMAL HIGH (ref 0.3–1.2)
Total Protein: 7.4 g/dL (ref 6.5–8.1)

## 2022-10-25 LAB — CBC
HCT: 42.2 % (ref 36.0–46.0)
Hemoglobin: 14.2 g/dL (ref 12.0–15.0)
MCH: 29 pg (ref 26.0–34.0)
MCHC: 33.6 g/dL (ref 30.0–36.0)
MCV: 86.3 fL (ref 80.0–100.0)
Platelets: 230 10*3/uL (ref 150–400)
RBC: 4.89 MIL/uL (ref 3.87–5.11)
RDW: 11.9 % (ref 11.5–15.5)
WBC: 4.7 10*3/uL (ref 4.0–10.5)
nRBC: 0 % (ref 0.0–0.2)

## 2022-10-25 LAB — URINALYSIS, ROUTINE W REFLEX MICROSCOPIC
Bilirubin Urine: NEGATIVE
Glucose, UA: NEGATIVE mg/dL
Hgb urine dipstick: NEGATIVE
Ketones, ur: 20 mg/dL — AB
Leukocytes,Ua: NEGATIVE
Nitrite: NEGATIVE
Protein, ur: NEGATIVE mg/dL
Specific Gravity, Urine: 1.027 (ref 1.005–1.030)
pH: 5 (ref 5.0–8.0)

## 2022-10-25 LAB — MAGNESIUM: Magnesium: 1.8 mg/dL (ref 1.7–2.4)

## 2022-10-25 LAB — LIPASE, BLOOD: Lipase: 30 U/L (ref 11–51)

## 2022-10-25 LAB — POC URINE PREG, ED: Preg Test, Ur: NEGATIVE

## 2022-10-25 LAB — TROPONIN I (HIGH SENSITIVITY): Troponin I (High Sensitivity): <2 ng/L (ref <18)

## 2022-10-25 MED ORDER — LORAZEPAM 2 MG/ML IJ SOLN
1.0000 mg | Freq: Once | INTRAMUSCULAR | Status: AC
  Administered 2022-10-25: 1 mg via INTRAVENOUS
  Filled 2022-10-25: qty 1

## 2022-10-25 MED ORDER — IOHEXOL 300 MG/ML  SOLN
100.0000 mL | Freq: Once | INTRAMUSCULAR | Status: AC | PRN
  Administered 2022-10-25: 100 mL via INTRAVENOUS

## 2022-10-25 MED ORDER — ONDANSETRON HCL 4 MG/2ML IJ SOLN
4.0000 mg | Freq: Once | INTRAMUSCULAR | Status: AC
  Administered 2022-10-25: 4 mg via INTRAVENOUS
  Filled 2022-10-25: qty 2

## 2022-10-25 MED ORDER — LACTATED RINGERS IV BOLUS
1000.0000 mL | Freq: Once | INTRAVENOUS | Status: AC
  Administered 2022-10-25: 1000 mL via INTRAVENOUS

## 2022-10-25 MED ORDER — KETOROLAC TROMETHAMINE 30 MG/ML IJ SOLN
15.0000 mg | Freq: Once | INTRAMUSCULAR | Status: AC
  Administered 2022-10-25: 15 mg via INTRAVENOUS
  Filled 2022-10-25: qty 1

## 2022-10-25 NOTE — ED Provider Notes (Signed)
[facility] Provider Note    Event Date/Time   First MD Initiated Contact with Patient 10/25/22 2334     (approximate)   History   Abdominal Pain   HPI  Amber Watts is a 22 y.o. female who presents to the ED for evaluation of Abdominal Pain   I review PCP visit from 4/8. Hx headaches, anxiety. Referred to neuro.   Patient presents to the ED with her mother for evaluation of recurrent emesis today.  She reports at least 50 episodes of emesis, initially nonbloody nonbilious, transitioning to bilious and then just dry heaving.  Reports developing flank discomfort later in the day, she attributes to the heaving.  Reports inability to keep anything down and recurrent emesis and feeling very thirsty.  No fevers or surgical history to the abdomen.  Denies stool or urinary changes beyond lesser output.  Reports she felt better with liter of IV fluids and benzodiazepines prior to my evaluation, but reports symptoms seem to be returning as his medications are wearing off.  Physical Exam   Triage Vital Signs: ED Triage Vitals  Enc Vitals Group     BP 10/25/22 1938 (!) 131/91     Pulse Rate 10/25/22 1938 (!) 138     Resp 10/25/22 1938 (!) 22     Temp 10/25/22 1938 98.7 F (37.1 C)     Temp Source 10/25/22 1938 Oral     SpO2 10/25/22 1938 100 %     Weight --      Height --      Head Circumference --      Peak Flow --      Pain Score 10/25/22 1947 6     Pain Loc --      Pain Edu? --      Excl. in GC? --     Most recent vital signs: Vitals:   10/25/22 1938 10/26/22 0214  BP: (!) 131/91 (!) 96/51  Pulse: (!) 138 (!) 111  Resp: (!) 22 18  Temp: 98.7 F (37.1 C)   SpO2: 100% 100%    General: Awake, no distress.  Dry mucous membranes. CV:  Good peripheral perfusion.  Resp:  Normal effort.  Abd:  No distention.  Diffuse and mild tenderness.  No peritoneal features. MSK:  No deformity noted.  Neuro:  No focal deficits appreciated. Other:     ED  Results / Procedures / Treatments   Labs (all labs ordered are listed, but only abnormal results are displayed) Labs Reviewed  COMPREHENSIVE METABOLIC PANEL - Abnormal; Notable for the following components:      Result Value   Potassium 3.2 (*)    Glucose, Bld 118 (*)    Total Bilirubin 1.4 (*)    All other components within normal limits  URINALYSIS, ROUTINE W REFLEX MICROSCOPIC - Abnormal; Notable for the following components:   Color, Urine YELLOW (*)    APPearance CLEAR (*)    Ketones, ur 20 (*)    All other components within normal limits  LIPASE, BLOOD  CBC  MAGNESIUM  POC URINE PREG, ED  TROPONIN I (HIGH SENSITIVITY)  TROPONIN I (HIGH SENSITIVITY)    EKG Sinus tachycardia with rate of 146 bpm.  Normal axis and intervals.  No clear signs of acute ischemia.  No comparison.  RADIOLOGY CT abdomen/pelvis interpreted by me without evidence of acute intra-abdominal pathology.  Official radiology report(s): CT ABDOMEN PELVIS W CONTRAST  Result Date: 10/26/2022 CLINICAL DATA:  recurrent emesis. generalized  pain EXAM: CT ABDOMEN AND PELVIS WITH CONTRAST TECHNIQUE: Multidetector CT imaging of the abdomen and pelvis was performed using the standard protocol following bolus administration of intravenous contrast. RADIATION DOSE REDUCTION: This exam was performed according to the departmental dose-optimization program which includes automated exposure control, adjustment of the mA and/or kV according to patient size and/or use of iterative reconstruction technique. CONTRAST:  OMNIPAQUE IOHEXOL 300 MG/ML  SOLN COMPARISON:  None Available. FINDINGS: Lower chest: No acute abnormality. Hepatobiliary: No focal liver abnormality. No gallstones, gallbladder wall thickening, or pericholecystic fluid. No biliary dilatation. Pancreas: No focal lesion. Normal pancreatic contour. No surrounding inflammatory changes. No main pancreatic ductal dilatation. Spleen: Normal in size without focal  abnormality. Adrenals/Urinary Tract: No adrenal nodule bilaterally. Bilateral kidneys enhance symmetrically. No hydronephrosis. No hydroureter. The urinary bladder is unremarkable. Stomach/Bowel: Stomach is within normal limits. No evidence of bowel wall thickening or dilatation. Appendix appears normal. Vascular/Lymphatic: No abdominal aorta or iliac aneurysm. No abdominal, pelvic, or inguinal lymphadenopathy. Reproductive: Uterus and bilateral adnexa are unremarkable. Other: No intraperitoneal free fluid. No intraperitoneal free gas. No organized fluid collection. Musculoskeletal: No abdominal wall hernia or abnormality. No suspicious lytic or blastic osseous lesions. No acute displaced fracture. IMPRESSION: No acute intra-abdominal or intrapelvic abnormality. Electronically Signed   By: Tish Frederickson M.D.   On: 10/26/2022 00:17    PROCEDURES and INTERVENTIONS:  Procedures  Medications  lactated ringers bolus 1,000 mL (1,000 mLs Intravenous New Bag/Given 10/25/22 2009)  LORazepam (ATIVAN) injection 1 mg (1 mg Intravenous Given 10/25/22 2004)  lactated ringers bolus 1,000 mL (1,000 mLs Intravenous New Bag/Given 10/25/22 2354)  ketorolac (TORADOL) 30 MG/ML injection 15 mg (15 mg Intravenous Given 10/25/22 2353)  ondansetron (ZOFRAN) injection 4 mg (4 mg Intravenous Given 10/25/22 2354)  iohexol (OMNIPAQUE) 300 MG/ML solution 100 mL (100 mLs Intravenous Contrast Given 10/25/22 2358)  droperidol (INAPSINE) 2.5 MG/ML injection 1.25 mg (1.25 mg Intravenous Given 10/26/22 0214)  lactated ringers bolus 1,000 mL (1,000 mLs Intravenous New Bag/Given 10/26/22 0215)     IMPRESSION / MDM / ASSESSMENT AND PLAN / ED COURSE  I reviewed the triage vital signs and the nursing notes.  Differential diagnosis includes, but is not limited to, viral syndrome, gastroenteritis, cannabis hyperemesis, cholelithiasis, appendicitis, SBO, UTI  {Patient presents with symptoms of an acute illness or injury that is potentially  life-threatening.  22 year old presents with recurrent emesis of uncertain etiology and suitable for trial of outpatient management with antiemetics.  Initially looks somewhat dry, improving clinical picture with antiemetics and fluids.  Benign abdomen with mild diffuse tenderness without peritoneal features.  Blood work with mild hypokalemia.  Normal lipase and CBC.  Urine with ketones, suggestive of dehydration, but no infectious features.  Troponin is negative and she is not pregnant.  CT is reassuring.  Tolerating p.o. and feeling better after some IV fluids and antiemetics.  Suitable for trial of outpatient management.  Clinical Course as of 10/26/22 0327  Sun Oct 26, 2022  0206 Reassessed, discussed reassuring CT [DS]  0321 Reassessed.  Feeling better.  Tolerating p.o.  Both parents now at the bedside.  We discussed possible etiologies, plan of care and return precautions [DS]    Clinical Course User Index [DS] Delton Prairie, MD     FINAL CLINICAL IMPRESSION(S) / ED DIAGNOSES   Final diagnoses:  Nausea vomiting and diarrhea  Dehydration     Rx / DC Orders   ED Discharge Orders  Ordered    ondansetron (ZOFRAN-ODT) 4 MG disintegrating tablet  Every 8 hours PRN        10/26/22 0323             Note:  This document was prepared using Dragon voice recognition software and may include unintentional dictation errors.   Delton Prairie, MD 10/26/22 [phone]

## 2022-10-25 NOTE — ED Triage Notes (Addendum)
Pt to ED via POV c/o abd pain and vomiting. Pt states right sided abd pain and epigastric pain and burning with urination. Pt having diarrhea as well. Pt also complaining of right facial pain and right leg pain that started this morning. Pt reports hx of anxiety and chronic headaches

## 2022-10-26 MED ORDER — LACTATED RINGERS IV BOLUS
1000.0000 mL | Freq: Once | INTRAVENOUS | Status: AC
  Administered 2022-10-26: 1000 mL via INTRAVENOUS

## 2022-10-26 MED ORDER — DROPERIDOL 2.5 MG/ML IJ SOLN
1.2500 mg | Freq: Once | INTRAMUSCULAR | Status: AC
  Administered 2022-10-26: 1.25 mg via INTRAVENOUS
  Filled 2022-10-26: qty 2

## 2022-10-26 MED ORDER — ONDANSETRON 4 MG PO TBDP: 4.0000 mg | ORAL_TABLET | Freq: Three times a day (TID) | ORAL | 0 refills | Status: DC | PRN

## 2022-10-26 NOTE — Discharge Instructions (Addendum)
Try using the Zofran medication as needed for any nausea and vomiting.  Start slow with liquids, soups and broths and escalate to solid foods if you tolerate these liquids okay  If you were to develop worsening symptoms or fevers with your symptoms then please return to the ED

## 2022-10-27 NOTE — Telephone Encounter (Signed)
Will try to contact [employer] again about PA [phone]

## 2022-10-30 ENCOUNTER — Telehealth: Payer: Self-pay | Admitting: Family

## 2022-10-30 NOTE — Telephone Encounter (Signed)
ok to send

## 2022-10-30 NOTE — Telephone Encounter (Signed)
Pt states [place] is going [place] the [place] weeks on June 24th and wanted to know if she can get a 90 day supply of  Galcanezumab-gnlm (EMGALITY) 120 MG/ML SOAJ  And  propranolol (INDERAL) 10 MG tablet  Please advise.

## 2022-10-31 ENCOUNTER — Other Ambulatory Visit: Payer: Self-pay

## 2022-10-31 DIAGNOSIS — G44029 Chronic cluster headache, not intractable: Secondary | ICD-10-CM

## 2022-10-31 MED ORDER — PROPRANOLOL HCL 10 MG PO TABS
20.0000 mg | ORAL_TABLET | Freq: Two times a day (BID) | ORAL | 2 refills | Status: DC
Start: 2022-10-31 — End: 2022-11-27

## 2022-10-31 MED ORDER — EMGALITY 120 MG/ML ~~LOC~~ SOAJ
1.0000 | SUBCUTANEOUS | 5 refills | Status: DC
Start: 2022-10-31 — End: 2022-11-27

## 2022-10-31 NOTE — Telephone Encounter (Signed)
RX sent

## 2022-11-11 ENCOUNTER — Telehealth: Payer: Self-pay

## 2022-11-11 ENCOUNTER — Encounter: Payer: Self-pay | Admitting: Physician Assistant

## 2022-11-11 ENCOUNTER — Ambulatory Visit (INDEPENDENT_AMBULATORY_CARE_PROVIDER_SITE_OTHER): Payer: BC Managed Care – PPO | Admitting: Physician Assistant

## 2022-11-11 DIAGNOSIS — F32A Depression, unspecified: Secondary | ICD-10-CM | POA: Diagnosis not present

## 2022-11-11 DIAGNOSIS — F900 Attention-deficit hyperactivity disorder, predominantly inattentive type: Secondary | ICD-10-CM | POA: Diagnosis not present

## 2022-11-11 DIAGNOSIS — G4700 Insomnia, unspecified: Secondary | ICD-10-CM | POA: Diagnosis not present

## 2022-11-11 DIAGNOSIS — F3281 Premenstrual dysphoric disorder: Secondary | ICD-10-CM

## 2022-11-11 NOTE — Progress Notes (Signed)
[facility]  Patient ID: Amber Watts,  MRN: [id number]  PCP: Dulce Sellar, NP  Date of Evaluation: 10/07/2022 Time spent:20 minutes  Chief Complaint:  Chief Complaint   Insomnia; Anxiety    HISTORY/CURRENT STATUS: HPI For routine med check  Hasn't been able to get the [place], and Adderall or Vyvanse isn't able to be found anywhere. Still having trouble focusing and staying on task.  She is finished with her classes this semester.  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  She is not able to take any opiates, benzodiazepines, or amphetamines per the [place] consulate website.   Thinks her mood would be even better if the ADHD was better treated.  Energy and motivation are low.  She does not feel depressed though, just "blah."  ADLs and personal hygiene are normal.  Appetite is normal and weight is stable.  No suicidal or homicidal thoughts.  No reports of anxiety.  She does still have problems with severe PMS.  The Zoloft has helped a lot but she still gets moody the week before her cycle.  Patient denies increased energy with decreased need for sleep, increased talkativeness, racing thoughts, impulsivity or risky behaviors, increased spending, increased libido, grandiosity, increased irritability or anger, paranoia, or hallucinations.  Denies dizziness, syncope, seizures, numbness, tingling, tremor, tics, unsteady gait, slurred speech, confusion. Denies muscle or joint pain, stiffness, or dystonia.  Individual Medical History/ Review of Systems: Changes? :No   Past medications for mental health diagnoses include: Prozac, Lexapro, Focalin XR caused tremor in hands, Wellbutrin was ineffective, Hydroxyzine was ineffective for sleep, Buspar, Zoloft premenstrually, Vyvanse, Adderall IR  Allergies: Patient has no known allergies.  Current Medications:  Current Outpatient Medications:    Acetylcysteine 600 MG CAPS,  Take 1 capsule (600 mg total) by mouth 2 (two) times daily., Disp: , Rfl: 0   b complex vitamins capsule, Take 1 capsule by mouth daily., Disp: , Rfl:    cyclobenzaprine (FLEXERIL) 5 MG tablet, Take 1-2 tablets (5-10 mg total) by mouth 3 (three) times daily as needed for muscle spasms (Headaches)., Disp: 30 tablet, Rfl: 2   DULoxetine (CYMBALTA) 60 MG capsule, Take 2 capsules (120 mg total) by mouth daily., Disp: 180 capsule, Rfl: 3   Galcanezumab-gnlm (EMGALITY) 120 MG/ML SOAJ, Inject 1 each into the skin every 30 (thirty) days., Disp: 3.36 mL, Rfl: 5   naproxen (NAPROSYN) 500 MG tablet, Take 1 tablet (500 mg total) by mouth 2 (two) times daily with a meal. Take 1 tab at first sign of headache along with the Cyclobenzaprine (Flexeril), Disp: 15 tablet, Rfl: 2   ondansetron (ZOFRAN-ODT) 4 MG disintegrating tablet, Take 1 tablet (4 mg total) by mouth every 8 (eight) hours as needed., Disp: 20 tablet, Rfl: 0   propranolol (INDERAL) 10 MG tablet, Take 2 tablets (20 mg total) by mouth 2 (two) times daily. For headache prevention. OK to increase to 3 times daily if needed., Disp: 120 tablet, Rfl: 2   sertraline (ZOLOFT) 25 MG tablet, BEGIN 5 DAYS BEFORE MENSES BEGINS AND DAYS 1 AND 2 OF CYCLE, THEN STOP, Disp: 90 tablet, Rfl: 1   traZODone (DESYREL) 100 MG tablet, Take 1-2 tablets (100-200 mg total) by mouth at bedtime as needed for sleep., Disp: 180 tablet, Rfl: 3   Methylphenidate HCl ER, PM, (JORNAY PM) 20 MG CP24, Take 1 capsule (20 mg total) by mouth at bedtime. (Patient not taking: Reported on 11/11/2022),  Disp: 30 capsule, Rfl: 0 Medication Side Effects: none  Family Medical/ Social History: Changes? No  MENTAL HEALTH EXAM:  There were no vitals taken for this visit.There is no height or weight on file to calculate BMI.  General Appearance: Casual, Well Groomed, and Obese  Eye Contact:  Good  Speech:  Clear and Coherent and Normal Rate  Volume:  Normal  Mood:  Euthymic  Affect:  Congruent   Thought Process:  Goal Directed and Descriptions of Associations: Circumstantial  Orientation:  Full (Time, Place, and Person)  Thought Content: Logical   Suicidal Thoughts:  No  Homicidal Thoughts:  No  Memory:  WNL  Judgement:  Good  Insight:  Good  Psychomotor Activity:  Normal  Concentration:  Concentration: Fair and Attention Span: Fair  Recall:  Good  Fund of Knowledge: Good  Language: Good  Assets:  Desire for Improvement Financial Resources/Insurance Housing Transportation Vocational/Educational  ADL's:  Intact  Cognition: WNL  Prognosis:  Good   DIAGNOSES:    ICD-10-CM   1. Attention deficit hyperactivity disorder (ADHD), predominantly inattentive type  F90.0     2. PMDD (premenstrual dysphoric disorder)  F32.81     3. Insomnia, unspecified type  G47.00     4. Melancholy  F32.A       Receiving Psychotherapy: Yes    [name]  RECOMMENDATIONS:  PDMP was reviewed.  Last Adderall was filled 08/22/2022. Vyvanse filled 08/21/2022  I provided 20 minutes of face to face time during this encounter, including time spent before and after the visit in records review, medical decision making, counseling pertinent to today's visit, and charting.   Discussed her symptoms.  We will try to get the [place] PM approved. The [place] PM is a methylphenidate, which is not listed on the [place] consulate website in [place], so hopefully she would be able to take it with her.  She understands that it is a controlled substance, on the same classification as an amphetamine.  We will consider modafinil or armodafinil if all else fails.  Also may be a problem on her trip though.  For the PMDD, she may need to see her GYN to get hormonal therapy, not only using the Zoloft.  She understands.  Continue NAC 600 mg, 1 p.o. twice daily. Continue Cymbalta 60 mg, 2 p.o. daily. Start Jorn[place] PM 20 mg p.o. nightly. Continue Zoloft 25 mg p.o. daily beginning 5 days prior to menses and then days 1 and 2  of the cycle and then stop it. Continue  trazodone 100 mg,  1-2 p.o. nightly as needed sleep..  Continue multivitamin, B complex, and iron. Continue therapy with [name]. Return in 4 weeks.   [name], PA-C

## 2022-11-11 NOTE — Telephone Encounter (Signed)
Contacted [employer] again over the phone about her PA for [place], gave diagnosis code then verified office information for rep to fax over paper work. He reports not seeing a form for [place] but would something. Waiting on fax. Amber Watts is aware.

## 2022-11-14 ENCOUNTER — Ambulatory Visit (INDEPENDENT_AMBULATORY_CARE_PROVIDER_SITE_OTHER): Payer: BC Managed Care – PPO | Admitting: Clinical

## 2022-11-14 DIAGNOSIS — F840 Autistic disorder: Secondary | ICD-10-CM | POA: Diagnosis not present

## 2022-11-14 NOTE — Progress Notes (Signed)
Time: 3:00pm-4:00pm CPT Code: 16109U 1 unit Diagnosis: F84.0  Amber Watts was seen remotely using secure video conferencing for an hour long feedback session (1 hour, 96133P 1 unit). She was in her home and the therapist was in her home at the time of the appointment. Amber Watts was receptive to all results and recommendations and requested that a copy of the report be mailed to her home address.   Examiner completed 5 hours report writing (2 hours on 10/23/22, 1 hour on 10/28/22, 1 hour on 11/07/22, and 1 hour on 11/11/22, 96133P 5 units).     Name: Amber Watts Date of Birth: 2000/07/23 Dates of Evaluation: 09/09/2022, 09/25/2022, 10/03/2022 Chronological Age: 22 years, 2 months Examiner: Maecie Sevcik L. Dewayne Hatch, Ph.D., HSP-P  Reason for Referral Amber Watts was referred for an evaluation by her therapist, Brett Canales, MA, [facility], [facility], [facility], after Amber Watts shared suspicion that she demonstrates characteristics of ASD. Amber Watts reported that she took the Ritvo Autism Asperger Diagnostic Scale-Revised (RAADS-R) test online, and received an elevated score in the elevated range and higher than her friends.   Assessments Administered Semi-Structured Developmental History Interview based on Autism Diagnostic Interview-Revised (Parent and Self Report) Social Communication Questionnaire Lifetime Form (SCQ, Parent Report) Social Responsiveness Scale, 2nd Edition (Self, Friend Report) Adult Self Report (ASR, Self Report) Adult Behavior Checklist (ABCL, Parent, Therapist Report) Adaptive Behavior Assessment System, 3rd Edition (ABAS-3, Parent Report) Stanford-Binet Scales of Intelligence, 5th Edition (SB-5) Autism Diagnostic Observation Schedule, 2nd Edition (ADOS-2), Module 4  Previous Diagnoses Amber Watts was diagnosed at 45 with generalized anxiety disorder by her psychiatrist at the time  Amber Watts was diagnosed with Attention Deficit/Hyperactivity Disorder at 80 by [facility] Premenstrual  Dysphoric Disorder (PMDD) diagnosed at 31   Medical History Amber Watts's mother fell pregnant with her at 5 years of age. Amber Watts's mother reported that she began bleeding from early in the pregnancy due to Amber Watts's placement in utero. Amber Watts was delivered via C-section after her mother spontaneously went into labor at [redacted] weeks gestation. Delorise Shiner had water in her lungs at birth, but this resolved, and she was able to go home from the hospital within a typical timeframe. Amber Watts reported that she had colic within her first year of life. Amber Watts's mother reported that she typically only slept 4 hours total per day and did not Watts during his first year of life. The most Amber Watts slept as an infant was 8-9 hours. Amber Watts's mother reported that her head was flat on one side from birth, and she wore a helmet to correct this. She no longer needed the helmet by her first birthday. Amber Watts's mother shared that she demonstrated toe walking from about 22 years of age, and then began walking on the sides of her feet. Amber Watts reported that she rolled her ankle at 7 and cut her foot on broken glass as a result, which resulted in having to have stitches. She was diagnosed with fifth disease in 4th grade. Amber Watts shared suspicion that it never fully went away, and continues to flare up when she gets very hot. When she was in middle school, she went to urgent care with fear that she was having a heart attack due to difficulty breathing, but learned that she was having a panic attack. She shared that she was often easily overwhelmed in middle and high school, resulting in frequent panic attacks. In February of 2024, she was diagnosed with chronic cluster headaches. Amber Watts reported that she has suffered headaches much of her life, but they worsened in  summer of 2023. At intake, she was prescribed Emgality injections for headaches, duloxetine, trazodone, Vyvanse, Adderall, sertraline and accutane for PMDD. In the past, she has also been prescribed  birth control pills. She reported having taken them for three years in the past to manage irregular periods. Following her initial ADHD diagnoses, she was prescribed Vyvanse and was unsure if she had tried other prescription medications. She has also previously been prescribed hydroxyzine to manage sleep. Amber Watts reported that she had difficulty sleeping in childhood, and was expelled from preschool due to not napping. She reported that her parents were encouraged not to re-enroll her due to "defiance." She reported that she often woke up at night as a child, and experienced anxiety about waking up. She "never really slept through the whole night unless I was really really exhausted." Currently, she reported that "sometimes the trazodone works and sometimes it doesn't," and reported that she has a difficult time falling and staying asleep when it does not. She often wakes up multiple times per night, sometimes for as long as an hour. She shared that she has always been labeled as a very picky eater, including during her childhood. At intake, she shared that she eats the same things most days, and is reluctant to try new foods. She reported that she sometimes does not like even preferred foods when they are made differently. She shared that this causes stress and inhibits her from eating out. She shared that she has always been described as a very picky eater.   Family History Family history is significant for depression, ADHD, anxiety, OCD, suspected PMDD. Amber Watts's older half-sister has been diagnosed with short term memory loss. Other family members are suspected to have experienced learning disabilities. Amber Watts grew up with three older sisters aged 60, 38, and 11.    Educational History At intake, Amber Watts lived in the dorms at [employer], where she was attending college. She shared that she still sees her parents, who live in [place], often. She also reported that she has a 2 year old sister who lives with her  parents due to difficulty maintaining employment. She was born in [place]. She was cared for in the home by her mother following delivery. Amber Watts began attending a church-based daycare between the ages of 3-4. However, she was reported to throw her shoes in the air during Watts time, and teachers at the time described her as defiant. The family voluntarily changed her school at the recommendation of her teachers, and transitioned to [employer] after 6-8 months. This was reported to have gone better, and she continued there until kindergarten, returning for camp there in the summer. She began kindergarten at [employer]. In first grade, she attended [employer] for roughly one month. However, she reported that she "hated it" and cried daily, until she was transferred back to [place], where she continued through 5th grade. Amber Watts reported that she frequently got into trouble in elementary school for asking the reasons for tasks she was ask to required to complete. She was able to complete elementary school without an IEP or 504 plan, and reported no significant behavioral or academic concerns during this period. She has never had an IEP or 504 plan.  She began attending middle school at [employer] in 6th grade. She reported that this was "awful," but she continued until halfway through 9th grade. Amber Watts reported that she was very emotionally sensitive during this period, and often cried. Educators often responded by telling  her she had no reason to cry. She also struggled with inconsistency in academics between middle and elementary school instruction. She reported that she also struggled socially, but was able to make friends "with the other people who didn't fit in." She transferred to [employer] halfway through 9th grade. She described the second half of 9th grade as especially challenging due to culture shock. She noted that  she had already been struggling with math, and was transferred into a math class where she particularly struggled with her new teacher's approach. She nonetheless was able to make friends during the remainder of 9th grade. COVID19 began halfway through her 10th grade year. She attended 11th grade partially remotely and partially in person. Amber Watts reported that she found typically challenging subjects especially challenging to learn remotely, and often "shut down" emotionally. In 12th grade, she reported that she had a very good psychology teacher who helped her when she shut down. She enrolled at [employer] after graduating high school in 2022. 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 She reported that college had been "a lot." She shared that she has experienced symptoms of what sounded to her like "autistic burnout" since starting college. She has struggled to keep up with classes. She has considered cutting back on her courseload, but has to be enrolled full time in school in order to stay on her parents' health insurance plan.   Developmental and Behavioral History Early Concerns and Developmental Milestones Amber Watts's mother reported that she has always more mature than similarly aged children. 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 Others have frequently commented that Amber Watts has "an old soul." She noted no concerns during Amber Watts's toddlerhood or very early childhood, but did comment that "she did not act like a normal child," and often did not want to do things that similarly aged peers commonly did. At the time, this was attributed to Amber Watts having significantly older siblings. With regard to developmental milestones, Amber Watts was reported to have started walking shortly before her first birthday. Amber Watts was reported to have  spontaneously decided to start wearing underwear and no longer needed diapers after about 29 months of age. Amber Watts's mother could not recall when she first started using single words, but no speech delay has ever been suspected and all speech milestones were met within normal limits. She was reported to have met all language milestones within a typical timeframe. No skill regressions were reported.   Social Affect Amber Watts's mother reported that she is able to engage in reciprocal conversation. However, she and Amber Watts reported that she responds inconsistently to non-question statements. Amber Watts noted that she often feels as though she does not know what to say. Amber Watts and her mother shared that she is "not good at small talk." Amber Watts's mother reported that she was able to engage in reciprocal conversation when she was between the ages of 4-5. Her mother shared that she "does not do eye contact well." Amber Watts shared that she does not like making eye contact, and added "it just feels kind of weird, like we're staring each other down." Amber Watts's mother noted no issues with her eye contact when she was between the ages of 4-5. With regard to socially inappropriate comments or behaviors, Amber Watts shared an example of responding to receiving a bag of candy  for easter by saying, "you can have those, but thank you," which was offensive to family members. Her mother also shared an example of Amber Watts giving back a gift she did not like when she was between the ages of 5-6. Amber Watts noted that, even at present, she does not understand how this might be perceived as hurtful. No pronoun confusion was reported currently or in the past. Amber Watts and her mother reported that she does not currently point to express interest or make requests. Her mother reported that she did point to express interest and request in early childhood, but could not recall if she did so with eye contact. Amber Watts and her mother reported that she nods her head to  mean "yes" and shakes her head to mean "no" at present. She also did so when she was between the ages of 4-5. Amber Watts and her mother reported that she uses a range of gestures to communicate at present. She also did so when she was between the ages of 4-5. Amber Watts's mother reported that she engaged in pretend play when she was between the ages of 4-5. At that age, she had an imaginary friend named "Amber Watts" that she pretended so consistently that, at the time, others became concerned that she may have been having hallucinations. Her mother described her descriptions of "Amber Watts" as so vivid that others believed she was real. When Amber Watts was between the ages of 5-6, Amber Watts "went to the dentist and never came back." Amber Watts was reported to also engage in play that involved the use of dolls and figures as agents. Her mother noted that play was highly imaginative and "conversations were intense and real." She noted that the plot changed from day-to-day. At times, however, Amber Watts acted out commercials she had seen with her dolls. Amber Watts was reported to show items of interest at present. Her mother noted that she is especially likely to share content related to interests in anime, funco pops, and comic books. She has items arranged in her room, and enjoys showing them to others by taking them on "tours." At present, she typically shows items related to specific interests. She was also reported to have shown a range of items when she was between the ages of 4-5. When she was between the ages of 38-15, she was reported to have had reciprocal friendships centered on a variety of interests. When she was between the ages of 4-5, she was reported to have had friends whose birthday parties she attended. When she was between the ages of 4-5 and in the presence of similarly aged peers she did not know, Amber Watts reported that she didn't "Venture out," and tended to stay with activities and people who were familiar. She was reported to have  engaged in parallel play but rarely approached peers she did not know to initiate play.  Restricted and Repetitive Behaviors With regard to stereotyped and idiosyncratic use of speech, Amber Watts reported that she repeats phrases from TikTok to her dog because she enjoys the sound of it. She also shared that, while in high school and driving to school, she heard the word "perplexed" in a song she began using it when she felt it would fit in the context. Amber Watts's mother reported that she repeated certain words and phrases, including taglines from commercials, in early childhood. No neologisms were reported currently or in the past. Amber Watts reported that she sometimes combines words (e.g., using confused and puzzled to say "confuzzled"). No verbal or behavioral rituals were reported currently or  in the past. Amber Watts's mother reported that she does not like when food touches on her plate, and does not eat food that has touched. Amber Watts reported that she has been told by one of her closest friends that she speaks with a very monotone intonation. Amber Watts commented that she has a "fake voice" when she has to perform customer service. She also reported that she has also been asked to quiet down and/or speak up when she thinks she is speaking at a normal volume. Amber Watts's mother commented "at home, she's loud all the time." Amber Watts demonstrated her customer service voice, and how she adjusts her intonation to be less monotone and speak at a higher pitch. She has observed that "people do not respond well" if she speaks with her natural intonation because she sounds "bored."  Amber Watts's mother shared that, as a young child, she demonstrated a preoccupation with bags. Her sisters referred to her as "the bag lady," because she walked from place to place with 3-4 [employer] bags, "full of who knows what." At present, she especially enjoys tote bags, and purchases tote bags and reusable bags whenever the opportunity presents. Her mother  noted that "wherever we go, she buys a reusable bag." Amber Watts shared that, when she was in elementary school, she had an "obsession with Babs Bertin," and she visited his house for her birthday. Her next interest was World War II, followed by [place], [place], and human trafficking. She shared that these interests are all consuming. Her mother noted that, "her interests were always a little more intense," and similarly aged peers often appeared bored while Amber Watts talked at length about preferred interests. Amber Watts's mother noted that Amber Watts's intense interests have tended to center on social justice issues. With regard to repetitive play, Amber Watts reported that she has always liked to organize items. She frequently pretended she was a Runner, broadcasting/film/video or going on a trip, and play centered on setting up her classroom or packing her bags. Amber Watts's mother reported that she also enjoyed planning the itinerary of family trips. In terms of sensory interests and aversions, Amber Watts reported that, at present, she is bothered by certain sounds, including the sound of electricity, or when people are speaking loudly in [employer], and babies crying. Amber Watts shared that she has a range of sensory aversions. She does not like when food is wet, getting grease on her hands, unexpected mouth textures and tastes of foods. She also reported that she does not like wearing jeans with a firm waistband. She does not like being hot, or the feeling of certain sweaters. She does not like the texture of the roof of a car. She shared that bright lights hurt her eyes. In terms of sensory interests, she reported that she has a weighted blanket. She likes the feeling of fuzzy stuffed animals, and the fur on dogs. No visual inspection was reported. Amber Watts shared that she enjoys having a routine, and becomes frustrated when she can't stay on one. However, she sometimes struggles to maintain routine, which she attributed to her ADHD diagnosis. Her mother  shared that she did not like changes in routine as a child. This was not to a degree that caused meltdowns or resulted in the family adjusting their behavior to avoid upsetting Amber Watts. Amber Watts described feeling very upset by changes but added that she went along with what was asked. In terms of hand mannerisms, Amber Watts reported that, currently, she pops her fingers and wrings her hands in the midline, as rolls her  hands at the wrist. Her mother could not recall whether she did this as a young child.   Adult Self Report for Ages 20-59 (ASR) To provide a general overview of Amber Watts's own perceptions of her behavior, as well as to screen for additional symptomatology, she completed the Adult Self Report (ASR) for Ages 57-59. The ASR provides scores across 6 domains: Depressive Problems, Anxiety Problems, Somatic Problems, Avoidant Personality Problems, AD/H Problems, and Antisocial Personality Problems. Amber Watts endorsed clinically significant scores across all domains of the ASR, with her highest elevation endorsed on the AD/H Problems domain. Overall, Amber Watts's report indicates that she experiences a range of behavioral and emotional challenges, especially in terms of difficulty focusing her attention and tasks involving executive functioning skills.   Adult Behavior Checklist for Ages 39-59 [facility]) To provide further information on Amber Watts's behavioral and emotional functioning and screen for additional symptomatology, her mother, Seeta Raymon, and therapist, Kelton Pillar, [facility], completed the Adult Behavior Checklist for Ages 105-59 (ABCL). The ABCL asks raters to respond to questions across a range of behavioral presentations. Scores are then computed into subscales, and compared to a sample of same-aged peers to produce percentile ranges and determine whether scores are in the normal, borderline, or clinically significant range of functioning. Amber Watts's mother endorsed scores in the at-risk range on the Somatic  Problems, Avoidant Personality Problems, and AD/H Problems domains. Her therapist endorsed scores in the clinically significant range on the Depressive Problems and Avoidant Personality Problems domains, and scores in the at-risk range on the Anxiety Problems and AD/H Problems domain. Overall, inter-rater report is consistent with Amber Watts's own report of a range of emotional and behavioral challenges, especially in terms of executive functioning, focus, depressive symptoms, and complaints of physical symptoms.   Social Responsiveness Scale, 2nd Edition (SRS-2): To specifically screen for ASD, Amber Watts and her friend, Dillard Cannon, completed the Social Responsiveness Scale, 2nd Edition (SRS-2). The SRS-2 is a screening tool used to help identify social impairments commonly associated with ASD, as well as gage their severity, as compared to typically developing peers. The SRS-2 provides T-scores across 5 domains of social functioning, as well as an overall score that can be indicative of the degree to which reported social functioning appears consistent with a diagnosis of ASD. Scores have a mean of 50 and a standard deviation of 10. Overall T-scores greater than 76 are considered to be strongly indicative of ASD, whereas scores in the range of 60-75 are considered to be in the mild to moderate range, and scores below 59 are considered to be in the normal range. Amber Watts's scores on the SRS-2 are provided in the table below.  SRS-2 Domain     Self Friend   Overall 7 82  Social Awareness 72 69  Social Cognition 70 74  Social Communication 65 12  Social Motivation 86 76  Autistic Mannerisms 25 57   Amber Watts and her friend consistently endorsed overall scores in the severe range, indicating the presence of clinically significant characteristics of ASD. Both raters endorsed clinically significant scores across all domains of the SRS-2, with scores in the severe range on the Social Motivation, Social  Communication, and Autistic Mannerisms domains, and scores in the mild-moderate range across the Social Awareness domain. On the Social Cognition domain, Amber Watts endorsed a score in the severe range, while her friend endorsed a score in the upper reaches of the moderate range. Taken together, inter-rater report indicates that Amber Watts subjectively experiences clinically significant characteristics of ASD, and these characteristics  are currently observable by others in her life.   Social Communication Questionnaire (SCQ) To provide additional input on the presence of characteristics of ASD over the course of Amber Watts's lifetime, including her early childhood, her mother completed the Social Communication Questionnaire, Lifetime Form. The SCQ is a 40-item measure designed to screen for symptoms of ASD in order to determine whether further evaluation is warranted. For questions pertaining to reciprocal social interactions, Amber Watts's mother was asked to report upon her presentation between the ages of 4-5, while she reported upon the presence of restricted and repetitive behaviors over the course of Amber Watts's life.  Scores above 15 are considered to be highly indicative of ASD and warrant further evaluation. Amber Watts's mother endorsed a score of 11 on the SCQ, placing her slightly below the clinically significant level. In terms of restricted and repetitive behaviors, Amber Watts's mother reported that she has used others' hands as tools and demonstrated unusual preoccupations, repetitive use of objects, and unusually intense interests. With regard to reciprocal social communication, she reported that, when she was between the ages of 68-5, Amber Watts did not coordinate gestures with words and vocalizations, did not use gestures including pointing to communicate with others, and did not spontaneously join into social play or appear interested in similarly aged children. Overall, maternal report fell below the clinically significant  level but nonetheless indicates the presence of some characteristics of ASD that have been observable since Amber Watts's early childhood.    Evaluation Summary Behavioral Observations Amber Watts was seen in person for cognitive and behavioral testing in early April of 2024. She was dressed and groomed appropriately for the situation and weather, and readily joined the examiner in the assessment room. Language consisted of fluid and complex speech, spoken with a slightly monotone intonation. Amber Watts reported that she had slept well the night before and felt prepared to engage in the assessment. During cognitive testing, she presented with a focused, diligent, and persistent working style. She attempted all presented items, progressively taking longer to respond as items became more challenging. On more challenging items with a time limit, Amber Watts tended to work throughout the time allotted, but took the option to move on once the examiner presented it. She often used strategies that included talking to herself under her breath and using her hands and fingers to count and "draw" out her thoughts on the table. On subtests that allowed her to use scratch paper, she was observed to diligently use the scratch paper. At times, she commented upon testing materials, expressing likes and dislikes (e.g., she commented, "I don't like how they don't line up" on a nonverbal Visual-Spatial reasoning task that required her to replicate figures presented in pictures using a series of shaped manipulatives). She also commented several times that she did not like a Working Memory task that required her to tap a sequence of blocks demonstrated by the examiner because, "it makes me feel dumb." At one point while completing this task, she looked down, clasping both hands around the base of her head, and appeared to become tearful. However, she calmed quickly, and was able to complete all items without requiring modifications to test protocol.  Amber Watts took a 10 minute break in the waiting room following cognitive testing, and returned to complete the ADOS-2, Module 4. As during cognitive testing, she demonstrated a pleasant, compliant demeanor, and was able to complete all presented tasks without requiring modifications to test protocol. Taken together, behavioral observations indicate that results from this evaluation can be considered an accurate  reflection of Amber Watts's current level of functioning.   Stanford-Binet Intelligence Scales, 5th Edition (SB5) To provide an overview of Amber Watts's current level of cognitive functioning, he completed the Stanford-Binet Intelligence Scales, 5th Edition (SB5). The SB5 assesses performance on tasks related to Fluid Reasoning, Knowledge, Quantitative Reasoning, Visual-Spatial Reasoning, and Working Memory across [employer] and Verbal domains of functioning. The SB-5 also provides a Full Scale IQ (FSIQ) which is calculated using scores on all other core subtests to provide a summary score of cognitive functioning. However, the FSIQ is not considered to be an accurate representation of functioning when the examinee demonstrates a significant discrepancy between domains and/or subtests. Scores are provided as standard scores, which have a mean of 100 and standard deviation of 15. Amber Watts's scores on the SB5 are provided in the table below.             SB-5 Domain Standard Score Scaled Score Percentile 95% Confidence Interval  Nonverbal IQ 83 13 78-90  Verbal IQ 97 42 91-103  Full Scale IQ 90 25 86-94  Fluid Reasoning  97 42 89-105  Nonverbal 9    Verbal  10    Knowledge  83 13 76-92  Nonverbal 4    Verbal 10    Quantitative Reasoning  89 23 82-98  Nonverbal 6    Verbal  10    Visual-Spatial Reasoning  85 16 78-94  Nonverbal  8    Verbal  7    Working Memory  103 58 95-111  Nonverbal  10    Verbal 11     Amber Watts's performance on the Verbal composite of the SB-5 fell significantly above her  performance on the Nonverbal composite, indicating that the FSIQ cannot be interpreted as an accurate reflection of her overall level of functioning, and scores are best interpreted at the composite level. On the Nonverbal IQ, she performed in the below average range and at the 13th percentile, indicating that she could be expected to perform as well as or better than 13 out of every 100 same-aged peers who take this test. In contrast, on the Verbal IQ Amber Watts performed in the middle of the average range and at the 42nd percentile. On the Fluid Reasoning composite, Amber Watts's score fell in the middle of the average range, consistent with her Verbal IQ. She performed comparably in the average range on the Nonverbal and Verbal Fluid Reasoning subtests. Amber Watts's weakest performance occurred on the Knowledge domain, where she performed in the below average range. She scored significantly more strongly on the Verbal subtest than the Nonverbal subtest, indicating that Nonverbal Knowledge is an area of relative and absolute weakness for Amber Watts. Notably, the Nonverbal Knowledge task involved verbally articulating strange or illogical aspects of a series of presented pictures. This suggests that Amber Watts may especially struggle translating visually presented information into verbal responses, as well as with attention to visual detail. On the Quantitative Reasoning domain, she performed in the upper reaches of the below average range. As on the Knowledge domain, Amber Watts performed significantly more strongly on Verbal than Nonverbal Knowledge subtests. Consistent with her performance on the Knowledge domain, Amber Watts's performance suggests that she performs more strongly when provided information verbally and permitted to offer a verbal response. Amber Watts's performance on the Visual-Spatial Reasoning domain fell in the middle of the below average range. She performed comparably across Nonverbal and Verbal subtests, indicating  comparably below average visual spatial reasoning abilities. Lastly, Amber Watts's strongest performance occurred on the Working Memory domain, where she performed  solidly in the average range. Amber Watts performed comparably across Nonverbal and Verbal Working Memory subtests, indicating an area of relative and absolute strength for Amber Watts. Overall, Amber Watts's performance is indicative of solidly developed verbal reasoning abilities, with areas of relative weakness in terms of her performance on tasks involving attention to visual detail.   Adaptive Behavior Assessment System, 3rd Edition (ABAS-3):  To provide a measure of Amber Watts's current level of adaptive functioning, her mother completed the Adaptive Behavior Assessment System, 3rd Edition (ABAS-3). The ABAS-3 provides a measure of adaptive functioning across Conceptual, Social, and Practical domains, as well as a Programmer, systems Composite (GAC) score as a summary measure of overall adaptive functioning. Domain scores are provided as standard scores, which have a mean of 100 and standard deviation of 15. Subdomain scores are provided as scaled scores, which have a mean of 10 and a standard deviation of 3. Amber Watts's scores on the ABAS-3 are provided in the table below.  ABAS-3, Parent Report  Standard Score Percentile Rank Confidence Interval   Scaled Score    Conceptual 99 47 95-103  Communication 10    Functional Academics 10    Self-Direction 11    Social 103 58 98-108  Leisure 10    Social 11    Practical 94 34 90-98  Community Use 13    Home Living 7    Health and Safety 12    Self-Care 7    GAC 98 45 95-101   Maternal report on the ABAS-3 is indicative of adaptive functioning that falls in the average range overall. However, although both scores fell within the average range, Amber Watts's mother endorsed a significantly stronger score on the Social domain than on the Practical domain, indicating that the [facility] cannot be interpreted as an accurate  reflection of her current level of functioning, and scores are best interpreted at the domain level. On the Conceptual domain, Amber Watts's mother reported that she talks with others about complex topics for at least 10 minutes, completes written forms to apply for jobs, and plans home projects in logical steps. However, she does not yet complete household chores at once when told to do so. On the Social domain, Amber Watts's mother reported that she joins organized groups without help from others and sends thank you notes or emails after receiving gifts or help. On the Practical domain, Amber Watts's mother reported that she is responsible for her personal finances, shops for the best prices, buys over the counter medications as needed for illness, and obtains haircuts regularly on her own. Taken together, Amber Watts's mother reported solidly developed adaptive functioning that is roughly on par with what might be expected based on Amber Watts's verbal IQ as measured by the SB-5, but moderately to significantly above what might be expected based on her Nonverbal IQ.   Autism Diagnostic Observation Schedule, 2nd Edition (ADOS-2), Module 4 To screen specifically for characteristics of autism spectrum disorder (ASD), the Autism Diagnostic Observation Schedule, 2nd Edition (ADOS-2) was administered to assess Amber Watts's social and behavioral functioning. The ADOS-2 is a semi-structured interaction designed to allow the examiner to observe for behaviors that are consistent with an ASD diagnosis across two domains: Social Affect (which includes nonverbal and reciprocal social functioning) and Restricted and Repetitive Behavior (which includes sensory interests, stereotyped language and motor movements, as well as excessive interests and repetitive behaviors). The ADOS-2 consists of four modules of activities, to be selected based on the individuals' language ability and developmental level. Amber Watts completed Module 4 of the ADOS-2. On  Module 4,  which requires fluent speech and is designed for older adolescents/adults, the original algorithm includes a threshold for the Communication and Social Interaction domains, as well as for the combined Communication and Social Interaction sections that must be met for a classification of autism spectrum disorder. However, according to a recently published article, an updated algorithm was created for the Module 4 of the ADOS. Specifically, per the article: "Clinically, the Module 4 revisions yield scores that provide a more accurate summary of ASD symptoms, with an algorithm that is more closely aligned with DSM-5 criteria than the original algorithm. It also affords good sensitivity and improved specificity compared to the original Module 4 algorithm. Although it is always recommended that the ADOS be used as one source of information in a diagnostic battery, good specificity is particularly important in the assessment of adults, for whom parents are not always available to provide the comprehensive developmental history that is often helpful in making differential diagnoses." (Hus, V. & Lord, C. (2014). The Autism Diagnostic Observations Schedule, Module 4: Revised Algorithm and Standardized Severity Scores. Journal of Autism and Developmental Disorders; 44(8), [phone].). As such, in addition to be being scored on the original algorithm, scores on the revised algorithm were also calculated. Amber Watts scored above the cut-off for "autism" on both the original and revised scoring algorithms for Module 4 of the ADOS-2.   Social Affect: Amber Watts demonstrated an array of strengths in terms of her social affect during the ADOS-2. She was able to provide a sequential account of a non-routine event (an incident during which she felt irritated by a classmate's comment), and demonstrated a clear sense of responsibility for her own self-care and future decisions. She also used descriptive gestures during two activities that  prompted her to do so, and labeled the emotional state of a character in a book. However, she also demonstrated several characteristics of ASD. She made eye contact with the examiner relatively inconsistently, tending instead to look down at materials in front of her. She rarely used gestures when not prompted to do so, and rarely directed facial expressions toward the examiner. Although she readily responded to questions, Amber Watts rarely engaged in social "chat" with the examiner, and her engagement in conversation tended to be somewhat one-sided, consisting of providing information about herself in response to the examiner's comments. Lastly, Amber Watts demonstrated a somewhat limited ability to describe her own emotional states, describing several emotions as simply "uncomfortable," and struggled to demonstrate understanding of her own role in relationships.  Restricted and Repetitive Behavior: Amber Watts demonstrated several instances of fluid and creative thinking during the ADOS-2 administration. She was able to use a set of random items to devise an imaginative story, and creatively narrated a story in a picture book. However, she made several comments suggesting a concrete, possibly rigid thought style, consisting of correcting and pointing out faults with test materials. She also alluded to several circumscribed interests (e.g., a "hyperfixation" on [place]), although she did not tend to bring these up excessively during the ADOS-2 administration.  Summary and Recommendations In order to meet criteria for ASD, individuals must demonstrate impaired functioning across two domains: Reciprocal Social Interaction/Social Affect and Restricted and Repetitive Behaviors. Additionally, individuals must demonstrate a history of impairment across these two domains beginning in early childhood, characteristics are expected to manifest across more than one setting, and impairment must not be better explained by a different  diagnosis.  During the developmental history, Amber Watts and her mother shared several possible characteristics of  ASD that have been present since her early childhood. These include inconsistent use of eye contact during reciprocal social interactions, difficulty engaging in reciprocal conversation, inconsistent engagement with similarly aged peers as a child, difficulty perceiving and responding to social cues, stereotyped speech, repetitive use of toys and other objects, an unusual preoccupation with tote bags that has been present since her very early childhood, unusually intense interests, difficulty with changes in routine, and sensory aversions to certain textures. Amber Watts and her mother's report is consistent with inter-rater report on the SRS-2 that fell in the severe range. This is also consistent with results from this evaluation, which include inconsistent eye contact, limited use of gestures, difficulty engaging in reciprocal conversation, and difficulty demonstrating insight into social relationships and emotional states. Notably, parent report on the SCQ (pertaining to lifetime presentation of characteristics of ASD) fell slightly below the clinically significant cut-off. However, based on parent report of Amber Watts's developmental history, it is believed that social impairment may not have become clearly observable until later in Amber Watts's childhood and adolescence, as interactions became more complex. This is consistent with the diagnostic criteria for ASD as listed in the DSM-V, which note that social impairment "may not become fully manifest until social demands exceed limited capacities, or may be masked by learned strategies later in life" (APA, 2013). Moreover, Amber Watts described at least one example of masking (that she intentionally adjusts her vocal intonation in certain settings). Masking or camouflaging is a behavior that is disproportionately common among girls and women with ASD (e.g., [place],  2018; Noel Christmas, & [place], 2016; [place], [place], & [place], 1610). Amber Watts's description of doing so further supports the conceptualization that she may have been able to effortfully mask some characteristics of ASD during her childhood and adolescence. Moreover, the psychological and emotional stress of masking may contribute to her current and past reported challenges with symptoms of burnout, anxiety, and depression. 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  Amber Watts may benefit from participation in a job coaching program to support her professional performance, such as is available through [employer] [employer] [phone], the [facility] [employer] supported employment program ([employer]Agents.ch, [phone].), or through the [employer] program, which connects individuals with ASD to jobs in the area and provides on-the-job coaching  (https://linc-it.org/). Amber Watts may benefit from participation in a social skills or meet-up group for neurodiverse individuals, such as is available through the [employer] at [phone]), or through online resources such as https://howell-gardner.net/. Should Amber Watts seek assistance with adaptive functioning or finding employment post-graduation, she may benefit from participation in the [facility] [phone]. Through her ASD diagnosis, Amber Watts may be eligible to receive health insurance through the [employer]  ([email or web address]://medicaid.http://[email or web address]).  Amber Watts is encouraged to share results of this evaluation with her university's academic accommodations (the Office of [employer], [phone]) department to determine whether she may be eligible for accommodations to support her functioning.  Amber Watts is encouraged to continue in individual therapy. She may especially benefit from strategies to help her regulate her emotions, practice self-advocacy, and support navigating social situations.   It was a pleasure to work with Amber Watts. Should you have any questions or require further assistance, please do not hesitate  to contact me.    Resources The Autism Society of [place] offers a range of resources to individuals with ASD and their families, including the opportunity to speak with a specialist to help coordinate care for newly diagnosed individuals. Their website can be found at: https://www.autismsociety-[place].org/# . To be placed in contact with a specialist, go to: https://www.autismsociety-[place].org/talk-with-a-specialist/  [facility]: The [facility] operates out of 7 regional centers across [place] offering intervention services for children and adults with ASD, as well as trainings for caregivers and educators working with individuals with ASD. The contact information for the [facility] ([place]) is: [phone], and their website can be found at: PreviewDomains.se.  [facility]: The [facility], located in [place], [place], offers a range of research and intervention opportunities for individuals with ASD. Their website can be found at: https://autismcenter.[email or web address].ch. For clinical services, call: [phone]. To learn more about ongoing research projects, contact:  [phone] Amber Watts's caregivers and teachers can access important, free information about ASD, including red flags, treatment options, and additional resources through the Autism Navigator website (https://autismnavigator.com/). The Organization for Autism Research (OAR) offers an array of resources for individuals with ASD, as well as their teachers and siblings, on their website: https://researchautism.org/resources/.  The [employer] ([facility]) on ASD offers several free online modules designed to help guide the use of empirically based interventions for individuals with ASD: https://afirm.PureLoser.pl [facility]ism [facility]: [facility] [employer] is a Transport planner aimed at addressing the needs of the autism community. [facility] offers a range of activities and workshops for individuals with ASD and their families, including siblings. Their website is: https://autismunbound.org/ [employer]: The [employer] is a local organization geared toward providing resources and support for individuals with ASD and their families. They offer several social groups for individuals with ASD from childhood into adulthood, including both casual opportunities to socialize as well as social skills training groups. You can contact their office by phone at: [phone]. Their website is: UpholsteryDesigners.gl [facility]: [facility] offers educational and behavioral health services for individuals with developmental differences. Their contact information is: [phone]. [employer] is a Tax inspector to research and service for individuals with ASD and their families. They offer a range of resources through their website, including a variety of free tool kits that can be printed out or used electronically. Among these tool kits is a "100 Day Kit," which breaks down important steps to take within the first 100 days of an ASD diagnosis. [facility]  tool kits can be found at: https://www.autismspeaks.org/family-services/tool-kitss [employer] of Social Work [employer] several helpful resources for individuals diagnosed with ASD and their families. Their services include a resource specialist who can help connect families with helpful resources, as well as opportunities to connect with other families affected by an ASD diagnosis. The website can be found at: http://www.smith-williams.com/ The Family Support Network of [address] offers services for families of children with special needs. Their website can be found at: http://www.lang.org/. The [facility]) offers a range of services for individuals diagnosed with developmental disabilities and their families, including early intervention services for children under the age of 3 and trainings for caregivers and teachers. Their website can be found at: https://www.ecac-parentcenter.org/training-and-events-calendar/ Individuals with ASD may be eligible for Medicaid services to help cover the cost of interventions. You may wish to contact the Local Management Entity-Managed Care Organization (  LME-MCO) for [facility], the [facility] at: [phone] to see what services Amber Watts might qualify for.   Reading List Autism Spectrum Disorders: The Complete Guide to Understanding Autism, Asperger's Syndrome, Pervasive Developmental Disorder, and Other ASDs by Lemmie Evens    The Hidden Curriculum: Practical Solutions for Understanding Unstated Rules in Social Situations by Alinda Dooms, Thomos Lemons, & Jack Quarto  Neurodivergent Mind by Maree Krabbe  Unmasking Autism: Discovering the New Faces of Neurodiversity by Nadean Corwin, PhD

## 2022-11-19 NOTE — Telephone Encounter (Signed)
Pt lvm checking on the status of the AP for her jornay

## 2022-11-27 ENCOUNTER — Telehealth: Payer: Self-pay | Admitting: Family

## 2022-11-27 ENCOUNTER — Other Ambulatory Visit: Payer: Self-pay

## 2022-11-27 DIAGNOSIS — G44029 Chronic cluster headache, not intractable: Secondary | ICD-10-CM

## 2022-11-27 MED ORDER — EMGALITY 120 MG/ML ~~LOC~~ SOAJ
1.0000 | SUBCUTANEOUS | 0 refills | Status: DC
Start: 2022-11-27 — End: 2023-02-19

## 2022-11-27 MED ORDER — PROPRANOLOL HCL 10 MG PO TABS
20.0000 mg | ORAL_TABLET | Freq: Two times a day (BID) | ORAL | 0 refills | Status: DC
Start: 2022-11-27 — End: 2023-05-08

## 2022-11-27 NOTE — Telephone Encounter (Signed)
PT IS GOING OUT OF COUNTRY AND WOULD LIKE A 90 DAY REFILL ON MEDS  Prescription Request  11/27/2022  LOV: 10/06/2022  What is the name of the medication or equipment?  Galcanezumab-gnlm (EMGALITY) 120 MG/ML SOAJ   propranolol (INDERAL) 10 MG tablet   Have you contacted your pharmacy to request a refill? No   Which pharmacy would you like this sent to?   [id number] [place], [place] - [address] Phone: [phone] Fax: [phone]    Patient notified that their request is being sent to the clinical staff for review and that they should receive a response within 2 business days.   Please advise at Mobile [phone] (mobile)

## 2022-11-27 NOTE — Telephone Encounter (Signed)
RX Sent. 

## 2022-12-10 ENCOUNTER — Encounter: Payer: Self-pay | Admitting: Physician Assistant

## 2022-12-10 ENCOUNTER — Telehealth: Payer: Self-pay

## 2022-12-10 ENCOUNTER — Ambulatory Visit (INDEPENDENT_AMBULATORY_CARE_PROVIDER_SITE_OTHER): Payer: BC Managed Care – PPO | Admitting: Physician Assistant

## 2022-12-10 DIAGNOSIS — F900 Attention-deficit hyperactivity disorder, predominantly inattentive type: Secondary | ICD-10-CM

## 2022-12-10 DIAGNOSIS — F3281 Premenstrual dysphoric disorder: Secondary | ICD-10-CM

## 2022-12-10 DIAGNOSIS — F840 Autistic disorder: Secondary | ICD-10-CM | POA: Diagnosis not present

## 2022-12-10 DIAGNOSIS — F4323 Adjustment disorder with mixed anxiety and depressed mood: Secondary | ICD-10-CM | POA: Diagnosis not present

## 2022-12-10 MED ORDER — JORNAY PM 20 MG PO CP24: 20.0000 mg | ORAL_CAPSULE | Freq: Every day | ORAL | 0 refills | Status: DC

## 2022-12-10 NOTE — Progress Notes (Signed)
[facility]  Patient ID: Amber Watts,  MRN: [id number]  PCP: Dulce Sellar, NP  Date of Evaluation: 12/10/2022 Time spent:30 minutes  Chief Complaint:  Chief Complaint   ADD; Depression; Anxiety; Follow-up    HISTORY/CURRENT STATUS: HPI For routine med check  Her insurance has denied the [place] PM twice.  She really needs help with focus and attention.  She is leaving in 2 weeks, going to [place] for 6 weeks for a study abroad program.  [place] is very strict about not allowing amphetamines but allow methylphenidate.  She has tried Focalin in the past.  She has trouble getting out of bed and waking up, that is one reason that [place] PM was chosen.  Other than this issue, she is doing well.  She is really looking forward to spending time in [place].  She will be taking 3 different college classes.  Patient is able to enjoy things.  Energy and motivation are good most of the time.  No extreme sadness, tearfulness, or feelings of hopelessness.  Sleeps ok.  Trazodone helps.  ADLs and personal hygiene are normal. Appetite has not changed.  Weight is stable.  Denies suicidal or homicidal thoughts.  PMDD is controlled with the Zoloft.  At least it is more tolerable.  No complaints of anxiety.  Patient denies increased energy with decreased need for sleep, increased talkativeness, racing thoughts, impulsivity or risky behaviors, increased spending, increased libido, grandiosity, increased irritability or anger, paranoia, or hallucinations.  Denies dizziness, syncope, seizures, numbness, tingling, tremor, tics, unsteady gait, slurred speech, confusion. Denies muscle or joint pain, stiffness, or dystonia. Denies unexplained weight loss, frequent infections, or sores that heal slowly.  No polyphagia, polydipsia, or polyuria. Denies visual changes or paresthesias.   Individual Medical History/ Review of Systems: Changes? :No   Past medications for mental health diagnoses  include: Prozac, Lexapro, Focalin XR caused tremor in hands, Wellbutrin was ineffective, Hydroxyzine was ineffective for sleep, Buspar, Zoloft premenstrually, Vyvanse, Adderall IR  Allergies: Patient has no known allergies.  Current Medications:  Current Outpatient Medications:    Acetylcysteine 600 MG CAPS, Take 1 capsule (600 mg total) by mouth 2 (two) times daily., Disp: , Rfl: 0   b complex vitamins capsule, Take 1 capsule by mouth daily., Disp: , Rfl:    cyclobenzaprine (FLEXERIL) 5 MG tablet, Take 1-2 tablets (5-10 mg total) by mouth 3 (three) times daily as needed for muscle spasms (Headaches)., Disp: 30 tablet, Rfl: 2   DULoxetine (CYMBALTA) 60 MG capsule, Take 2 capsules (120 mg total) by mouth daily., Disp: 180 capsule, Rfl: 3   Galcanezumab-gnlm (EMGALITY) 120 MG/ML SOAJ, Inject 1 each into the skin every 30 (thirty) days., Disp: 3.36 mL, Rfl: 0   naproxen (NAPROSYN) 500 MG tablet, Take 1 tablet (500 mg total) by mouth 2 (two) times daily with a meal. Take 1 tab at first sign of headache along with the Cyclobenzaprine (Flexeril), Disp: 15 tablet, Rfl: 2   propranolol (INDERAL) 10 MG tablet, Take 2 tablets (20 mg total) by mouth 2 (two) times daily. For headache prevention. OK to increase to 3 times daily if needed., Disp: 358 tablet, Rfl: 0   sertraline (ZOLOFT) 25 MG tablet, BEGIN 5 DAYS BEFORE MENSES BEGINS AND DAYS 1 AND 2 OF CYCLE, THEN STOP, Disp: 90 tablet, Rfl: 1   traZODone (DESYREL) 100 MG tablet, Take 1-2 tablets (100-200 mg total) by mouth at bedtime as needed for sleep., Disp: 180 tablet, Rfl: 3   Methylphenidate HCl ER, PM, (  JORNAY PM) 20 MG CP24, Take 1 capsule (20 mg total) by mouth at bedtime., Disp: 30 capsule, Rfl: 0 Medication Side Effects: none  Family Medical/ Social History: Changes? No  MENTAL HEALTH EXAM:  There were no vitals taken for this visit.There is no height or weight on file to calculate BMI.  General Appearance: Casual, Well Groomed, and Obese   Eye Contact:  Good  Speech:  Clear and Coherent and Normal Rate  Volume:  Normal  Mood:  Euthymic  Affect:  Congruent  Thought Process:  Goal Directed and Descriptions of Associations: Circumstantial  Orientation:  Full (Time, Place, and Person)  Thought Content: Logical   Suicidal Thoughts:  No  Homicidal Thoughts:  No  Memory:  WNL  Judgement:  Good  Insight:  Good  Psychomotor Activity:  Normal  Concentration:  Concentration: Fair and Attention Span: Fair  Recall:  Good  Fund of Knowledge: Good  Language: Good  Assets:  Communication Skills Desire for Improvement Financial Resources/Insurance Housing Transportation Vocational/Educational  ADL's:  Intact  Cognition: WNL  Prognosis:  Good   DIAGNOSES:    ICD-10-CM   1. Attention deficit hyperactivity disorder (ADHD), predominantly inattentive type  F90.0     2. Situational mixed anxiety and depressive disorder  F43.23     3. PMDD (premenstrual dysphoric disorder)  F32.81     4. Autism  F84.0      Receiving Psychotherapy: Yes    [facility] in [place], was referred there by Kelton Pillar d/t ins and the fact that they specialize in ASD in adults.  RECOMMENDATIONS:  PDMP was reviewed.  Last Adderall was filled 08/22/2022. Vyvanse filled 08/21/2022  I provided 30 minutes of face to face time during this encounter, including time spent before and after the visit in records review, medical decision making, counseling pertinent to today's visit, and charting.   I contacted Will Bonnet, the Rep for Jornay PM. The company give a 30 day supply free and the next Rxs (with [employer]) are no more than $75. Maralyn Sago came by, gave me specific instructions to tell pharmacy so they will run the coupon code correctly. I want to be able to make sure [name] has something to take with her to [place]. If this is effective over the next few weeks, I'll try to get pharmacy to give next Rx early since she'll be out of the  country when due. Traci Scroggins, LPN spoke with her insurance and they said they cover [place], no PA needed. In case the [place] isn't helpful, we have a Plan B.  Continue NAC 600 mg, 1 p.o. twice daily. Continue Cymbalta 60 mg, 2 p.o. daily. Start Jornay PM 20 mg, 1 qhs. Rx re-sent to pharmacy w/ all the code info given so hopefully pharmacy will supply free 30 days, if not, she'll call.  Continue Propranolol 10 mg, 2 bid prn.  Continue Zoloft 25 mg p.o. daily beginning 5 days prior to menses and then days 1 and 2 of the cycle and then stop it. Continue  trazodone 100 mg,  1-2 p.o. nightly as needed sleep..  Continue multivitamin, B complex, and iron. Continue therapy. Return in 8 weeks.   [name], PA-C

## 2022-12-11 ENCOUNTER — Telehealth: Payer: Self-pay | Admitting: Psychiatry

## 2022-12-11 ENCOUNTER — Other Ambulatory Visit: Payer: Self-pay

## 2022-12-11 NOTE — Telephone Encounter (Signed)
Next appt is 02/11/23. Requesting refill on Jornay 20 mg called to:  [address] [place], [place] [address] [phone]  This is a new pharmacy. Please add to chart.

## 2022-12-12 MED ORDER — JORNAY PM 20 MG PO CP24: 20.0000 mg | ORAL_CAPSULE | Freq: Every day | ORAL | 0 refills | Status: DC

## 2022-12-15 ENCOUNTER — Telehealth: Payer: Self-pay | Admitting: Family

## 2022-12-15 ENCOUNTER — Telehealth: Payer: Self-pay | Admitting: Physician Assistant

## 2022-12-15 NOTE — Telephone Encounter (Signed)
Please see message. °

## 2022-12-15 NOTE — Telephone Encounter (Signed)
LVM regarding message below.

## 2022-12-15 NOTE — Telephone Encounter (Signed)
Patient requests a Letter stating why Patient takes the medications she is prescribed, the medical conditions and why she is taking the medications.  Patient states she need the above Letter before the end of this week.  Patient requests to be called to be advised

## 2022-12-15 NOTE — Telephone Encounter (Signed)
Letter dictated

## 2022-12-15 NOTE — Telephone Encounter (Signed)
Pt LVM @ 12:54p.  She said she needs a letter from [place] about the meds she is on and why she is taking them.  She is sorry for the late notice, but she is going to [place] for 6 wks on Monday.  Next appt 8/14

## 2022-12-17 ENCOUNTER — Telehealth: Payer: Self-pay | Admitting: Physician Assistant

## 2022-12-17 NOTE — Telephone Encounter (Signed)
Pt advised letter ready for pickup 

## 2022-12-17 NOTE — Telephone Encounter (Signed)
Patient filled Amber Watts 6/14 and reports it is working well. She is asking if you will send in another Rx to cover her while she is in [place] or should she wait until her return to get it refilled.

## 2022-12-17 NOTE — Telephone Encounter (Signed)
I'll send in another Rx with a note to the pharmacy that she's leaving Monday for 6 weeks, but can't guarantee they'll fill it. Which pharmacy?

## 2022-12-17 NOTE — Telephone Encounter (Signed)
Pt left message reporting Ophelia Charter is working. Asking do you want to give another Rx before she goes to [place] or wait until she gets back. Contact # [phone]

## 2022-12-18 NOTE — Telephone Encounter (Signed)
[facility] [address] in [place]

## 2023-02-11 ENCOUNTER — Ambulatory Visit (INDEPENDENT_AMBULATORY_CARE_PROVIDER_SITE_OTHER): Payer: BC Managed Care – PPO | Admitting: Physician Assistant

## 2023-02-11 ENCOUNTER — Encounter: Payer: Self-pay | Admitting: Physician Assistant

## 2023-02-11 DIAGNOSIS — F4323 Adjustment disorder with mixed anxiety and depressed mood: Secondary | ICD-10-CM

## 2023-02-11 DIAGNOSIS — G4700 Insomnia, unspecified: Secondary | ICD-10-CM

## 2023-02-11 DIAGNOSIS — F3281 Premenstrual dysphoric disorder: Secondary | ICD-10-CM | POA: Diagnosis not present

## 2023-02-11 DIAGNOSIS — F840 Autistic disorder: Secondary | ICD-10-CM

## 2023-02-11 DIAGNOSIS — F900 Attention-deficit hyperactivity disorder, predominantly inattentive type: Secondary | ICD-10-CM | POA: Diagnosis not present

## 2023-02-11 MED ORDER — JORNAY PM 20 MG PO CP24: 20.0000 mg | ORAL_CAPSULE | Freq: Every day | ORAL | 0 refills | Status: DC

## 2023-02-11 NOTE — Progress Notes (Signed)
[facility]  Patient ID: Amber Watts,  MRN: [id number]  PCP: Dulce Sellar, NP  Date of Evaluation: 12/10/2022 Time spent:20 minutes  Chief Complaint:  Chief Complaint   ADD; Follow-up    HISTORY/CURRENT STATUS: HPI For routine med check  Amber Watts returned from the study abroad program in [place] just a few days ago.  She was there for almost 2 months.  She had a great experience except for the fact that she had walking pneumonia the last 2 weeks of the trip.  She is feeling much better now.  Before she left we started [place] PM.  She called a few days after starting it to let me know that it was working and we tried to get it approved so she could have enough to get her through her trip but apparently insurance would not cover it.  She continued to take her 1 month supply until it ran out.  Now states that it did not help at all but then she cannot remember.  She has not been on anything for a month to help with focus or attention.  Patient is able to enjoy things.  Energy and motivation are good.   No extreme sadness, tearfulness, or feelings of hopelessness.  Sleeps well most of the time. ADLs and personal hygiene are normal.  Appetite has not changed.  Weight is stable.  Denies suicidal or homicidal thoughts.  Patient denies increased energy with decreased need for sleep, increased talkativeness, racing thoughts, impulsivity or risky behaviors, increased spending, increased libido, grandiosity, increased irritability or anger, paranoia, or hallucinations.  Denies dizziness, syncope, seizures, numbness, tingling, tremor, tics, unsteady gait, slurred speech, confusion. Denies muscle or joint pain, stiffness, or dystonia.  Individual Medical History/ Review of Systems: Changes? :No   Past medications for mental health diagnoses include: Prozac, Lexapro, Focalin XR caused tremor in hands, Wellbutrin was ineffective, Hydroxyzine was ineffective for sleep, Buspar, Zoloft  premenstrually, Vyvanse, Adderall IR  Allergies: Patient has no known allergies.  Current Medications:  Current Outpatient Medications:    Acetylcysteine 600 MG CAPS, Take 1 capsule (600 mg total) by mouth 2 (two) times daily., Disp: , Rfl: 0   b complex vitamins capsule, Take 1 capsule by mouth daily., Disp: , Rfl:    cyclobenzaprine (FLEXERIL) 5 MG tablet, Take 1-2 tablets (5-10 mg total) by mouth 3 (three) times daily as needed for muscle spasms (Headaches)., Disp: 30 tablet, Rfl: 2   DULoxetine (CYMBALTA) 60 MG capsule, Take 2 capsules (120 mg total) by mouth daily., Disp: 180 capsule, Rfl: 3   Galcanezumab-gnlm (EMGALITY) 120 MG/ML SOAJ, Inject 1 each into the skin every 30 (thirty) days., Disp: 3.36 mL, Rfl: 0   naproxen (NAPROSYN) 500 MG tablet, Take 1 tablet (500 mg total) by mouth 2 (two) times daily with a meal. Take 1 tab at first sign of headache along with the Cyclobenzaprine (Flexeril), Disp: 15 tablet, Rfl: 2   propranolol (INDERAL) 10 MG tablet, Take 2 tablets (20 mg total) by mouth 2 (two) times daily. For headache prevention. OK to increase to 3 times daily if needed., Disp: 358 tablet, Rfl: 0   sertraline (ZOLOFT) 25 MG tablet, BEGIN 5 DAYS BEFORE MENSES BEGINS AND DAYS 1 AND 2 OF CYCLE, THEN STOP, Disp: 90 tablet, Rfl: 1   traZODone (DESYREL) 100 MG tablet, Take 1-2 tablets (100-200 mg total) by mouth at bedtime as needed for sleep., Disp: 180 tablet, Rfl: 3   Methylphenidate HCl ER, PM, (JORNAY PM) 20 MG CP24,  Take 1 capsule (20 mg total) by mouth at bedtime., Disp: 30 capsule, Rfl: 0 Medication Side Effects: none  Family Medical/ Social History: Changes? No  MENTAL HEALTH EXAM:  There were no vitals taken for this visit.There is no height or weight on file to calculate BMI.  General Appearance: Casual, Well Groomed, and Obese  Eye Contact:  Good  Speech:  Clear and Coherent and Normal Rate  Volume:  Normal  Mood:  Euthymic  Affect:  Congruent  Thought Process:  Goal  Directed and Descriptions of Associations: Circumstantial  Orientation:  Full (Time, Place, and Person)  Thought Content: Logical   Suicidal Thoughts:  No  Homicidal Thoughts:  No  Memory:  WNL  Judgement:  Good  Insight:  Good  Psychomotor Activity:  Normal  Concentration:  Concentration: Fair and Attention Span: Fair  Recall:  Good  Fund of Knowledge: Good  Language: Good  Assets:  Communication Skills Desire for Improvement Financial Resources/Insurance Housing Resilience Transportation Vocational/Educational  ADL's:  Intact  Cognition: WNL  Prognosis:  Good   DIAGNOSES:    ICD-10-CM   1. Attention deficit hyperactivity disorder (ADHD), predominantly inattentive type  F90.0     2. Situational mixed anxiety and depressive disorder  F43.23     3. PMDD (premenstrual dysphoric disorder)  F32.81     4. Insomnia, unspecified type  G47.00     5. Autism  F84.0       Receiving Psychotherapy: Yes    [facility] in [place], was referred there by Kelton Pillar d/t ins and the fact that they specialize in ASD in adults.  RECOMMENDATIONS:  PDMP was reviewed.  Jornay filled 12/12/2022. I provided 20 minutes of face to face time during this encounter, including time spent before and after the visit in records review, medical decision making, counseling pertinent to today's visit, and charting.   We discussed the [place] PM.  Prior to leaving for [place] she reported that it was effective so I recommend restarting it.  Patient states she is willing to try, she just could not remember it working.  Of course she had a lot on her plate the week or so leading up to her trip.  I recommend restarting it at 20 mg but if she is not doing any better in 4 days then double it to 40 mg nightly.  She should call and let me know so I can send in a 40 mg pill and we can get the PA done before she needs a new prescription.  She verbalizes understanding.  Continue NAC 600 mg, 1 p.o. twice  daily. Continue Cymbalta 60 mg, 2 p.o. daily. Restart Jornay PM 20 mg, 1 p.o. nightly.  (See directions above) Continue Propranolol 10 mg, 2 bid prn.  Continue Zoloft 25 mg p.o. daily beginning 5 days prior to menses and then days 1 and 2 of the cycle and then stop it. Continue  trazodone 100 mg,  1-2 p.o. nightly as needed sleep..  Continue multivitamin, B complex, and iron. Continue therapy. Return in 2 months.  [name], PA-C

## 2023-02-18 ENCOUNTER — Telehealth: Payer: Self-pay | Admitting: Physician Assistant

## 2023-02-18 NOTE — Telephone Encounter (Signed)
See message from patient. I called pharmacy to see if co-pay card had expired and they said it went thru for $75, which is the cost if insurance doesn't cover. The PA in [facility] was archived in April without a determination. There is a message in Epic that [employer] was supposed to fax [place] a form. I'll be glad to all on this if you can tell me details. She has only gotten it once, on 6/14 and was private pay.

## 2023-02-18 NOTE — Telephone Encounter (Signed)
[name] called and LM at 9:51.  She went to pick up her Amber Watts and the pharmacy told her the coupon didn't work anymore and the cost was really high.  Do you have other options?  Please calla to discuss.  Appt 10/14

## 2023-02-18 NOTE — Progress Notes (Unsigned)
NEUROLOGY CONSULTATION NOTE  Benetta Ruffino MRN: [id number] DOB: 2001/03/25  Referring provider: Dulce Sellar, NP Primary care provider: Dulce Sellar, NP  Reason for consult:  migraines  Assessment/Plan:   ***   Subjective:  Amber Watts is a 22 year old ***-handed female with ADHD, Autism spectrum disorder, depression and anxiety who presents for migraines.  History supplemented by referring provider's note.  ***  Past NSAIDS/analgesics:  *** Past abortive triptans:  *** Past abortive ergotamine:  none Past muscle relaxants:  none Past anti-emetic:  Zofran ODT 8mg  Past antihypertensive medications:  none Past antidepressant medications:  Wellbutrin, fluoxetine Past anticonvulsant medications:  *** Past anti-CGRP:  *** Past vitamins/Herbal/Supplements:  none Past antihistamines/decongestants:  none Other past therapies:  ***  Current NSAIDS/analgesics:  naproxen 500mg , Tylenol Current triptans:  none Current ergotamine:  none Current anti-emetic:  none Current muscle relaxants:  cyclobenzaprine 5-10mg  TID PRN Current Antihypertensive medications:  propranolol 20mg  BID  Current Antidepressant medications:  duloxetine 120mg  daily, sertraline 25mg  (perimenstrual prophylaxis), trazodone 100-200mg  at bedtime PRN (insomnia) Current Anticonvulsant medications:  none Current anti-CGRP:  Emgality every 4wks Current Vitamins/Herbal/Supplements:  none Current Antihistamines/Decongestants:  none Other therapy:  none Birth control:  none Other medications:  methylphenidate   Caffeine:  *** Alcohol:  *** Smoker:  *** Diet:  *** Exercise:  *** Depression:  ***; Anxiety:  *** Other pain:  *** Sleep hygiene:  *** Family history of headache:  ***      PAST MEDICAL HISTORY: Past Medical History:  Diagnosis Date   ADHD (attention deficit hyperactivity disorder)    Anxiety    Depression    Dysmenorrhea 05/10/2020   Persistent headaches 01/01/2022    Viral upper respiratory tract infection 04/17/2020    PAST SURGICAL HISTORY: Past Surgical History:  Procedure Laterality Date   FOOT SURGERY      MEDICATIONS: Current Outpatient Medications on File Prior to Visit  Medication Sig Dispense Refill   Acetylcysteine 600 MG CAPS Take 1 capsule (600 mg total) by mouth 2 (two) times daily.  0   b complex vitamins capsule Take 1 capsule by mouth daily.     cyclobenzaprine (FLEXERIL) 5 MG tablet Take 1-2 tablets (5-10 mg total) by mouth 3 (three) times daily as needed for muscle spasms (Headaches). 30 tablet 2   DULoxetine (CYMBALTA) 60 MG capsule Take 2 capsules (120 mg total) by mouth daily. 180 capsule 3   Galcanezumab-gnlm (EMGALITY) 120 MG/ML SOAJ Inject 1 each into the skin every 30 (thirty) days. 3.36 mL 0   Methylphenidate HCl ER, PM, (JORNAY PM) 20 MG CP24 Take 1 capsule (20 mg total) by mouth at bedtime. 30 capsule 0   naproxen (NAPROSYN) 500 MG tablet Take 1 tablet (500 mg total) by mouth 2 (two) times daily with a meal. Take 1 tab at first sign of headache along with the Cyclobenzaprine (Flexeril) 15 tablet 2   propranolol (INDERAL) 10 MG tablet Take 2 tablets (20 mg total) by mouth 2 (two) times daily. For headache prevention. OK to increase to 3 times daily if needed. 358 tablet 0   sertraline (ZOLOFT) 25 MG tablet BEGIN 5 DAYS BEFORE MENSES BEGINS AND DAYS 1 AND 2 OF CYCLE, THEN STOP 90 tablet 1   traZODone (DESYREL) 100 MG tablet Take 1-2 tablets (100-200 mg total) by mouth at bedtime as needed for sleep. 180 tablet 3   No current facility-administered medications on file prior to visit.    ALLERGIES: No Known Allergies  FAMILY HISTORY: Family History  Problem Relation Age of Onset   Depression Mother    Breast cancer Mother    Diabetes Father    Carpal tunnel syndrome Father    Anxiety disorder Father    Migraines Sister    Heart disease Maternal Grandfather    Dementia Paternal Grandfather    Heart attack Paternal  Grandmother    Asthma Sister    Anxiety disorder Sister     Objective:  *** General: No acute distress.  Patient appears well-groomed.   Head:  Normocephalic/atraumatic Eyes:  fundi examined but not visualized Neck: supple, no paraspinal tenderness, full range of motion Back: No paraspinal tenderness Heart: regular rate and rhythm Lungs: Clear to auscultation bilaterally. Vascular: No carotid bruits. Neurological Exam: Mental status: alert and oriented to person, place, and time, speech fluent and not dysarthric, language intact. Cranial nerves: CN I: not tested CN II: pupils equal, round and reactive to light, visual fields intact CN III, IV, VI:  full range of motion, no nystagmus, no ptosis CN V: facial sensation intact. CN VII: upper and lower face symmetric CN VIII: hearing intact CN IX, X: gag intact, uvula midline CN XI: sternocleidomastoid and trapezius muscles intact CN XII: tongue midline Bulk & Tone: normal, no fasciculations. Motor:  muscle strength 5/5 throughout Sensation:  Pinprick, temperature and vibratory sensation intact. Deep Tendon Reflexes:  2+ throughout,  toes downgoing.   Finger to nose testing:  Without dysmetria.   Heel to shin:  Without dysmetria.   Gait:  Normal station and stride.  Romberg negative.    Thank you for allowing me to take part in the care of this patient.  Shon Millet, DO  CC: ***

## 2023-02-19 ENCOUNTER — Encounter: Payer: Self-pay | Admitting: Neurology

## 2023-02-19 ENCOUNTER — Ambulatory Visit: Payer: BC Managed Care – PPO | Admitting: Neurology

## 2023-02-19 VITALS — BP 100/66 | HR 78 | Ht 67.0 in | Wt 175.0 lb

## 2023-02-19 DIAGNOSIS — H5500 Unspecified nystagmus: Secondary | ICD-10-CM | POA: Diagnosis not present

## 2023-02-19 DIAGNOSIS — G43709 Chronic migraine without aura, not intractable, without status migrainosus: Secondary | ICD-10-CM

## 2023-02-19 MED ORDER — AJOVY 225 MG/1.5ML ~~LOC~~ SOAJ: 1.5000 mL | SUBCUTANEOUS | 11 refills | Status: DC

## 2023-02-19 MED ORDER — SUMATRIPTAN SUCCINATE 100 MG PO TABS: 100.0000 mg | ORAL_TABLET | ORAL | 5 refills | Status: DC | PRN

## 2023-02-19 NOTE — Patient Instructions (Signed)
  MRI of brain with and without contrast Stop Emgality.  Start Ajovy injection every 28 days Take sumatriptan 100mg  at earliest onset of headache.  May repeat dose once in 2 hours if needed.  Maximum 2 tablets in 24 hours. Limit use of pain relievers to no more than 2 days out of the week.  These medications include acetaminophen, NSAIDs (ibuprofen/Advil/Motrin, naproxen/Aleve, triptans (Imitrex/sumatriptan), Excedrin, and narcotics.  This will help reduce risk of rebound headaches. Be aware of common food triggers Routine exercise Stay adequately hydrated (aim for 64 oz water daily) Keep headache diary Maintain proper stress management Maintain proper sleep hygiene Do not skip meals Consider supplements:  magnesium citrate 400mg  daily, riboflavin 400mg  daily, coenzyme Q10 300mg  daily.

## 2023-02-20 NOTE — Telephone Encounter (Signed)
Let me check on this. I believe she received a denial initially

## 2023-03-03 ENCOUNTER — Encounter: Payer: Self-pay | Admitting: Neurology

## 2023-03-11 ENCOUNTER — Ambulatory Visit
Admission: RE | Admit: 2023-03-11 | Discharge: 2023-03-11 | Disposition: A | Discharge status: Home / Self Care | Payer: BC Managed Care – PPO | Source: Ambulatory Visit | Attending: Neurology

## 2023-03-11 ENCOUNTER — Other Ambulatory Visit: Payer: Self-pay | Admitting: Neurology

## 2023-03-11 DIAGNOSIS — H5500 Unspecified nystagmus: Secondary | ICD-10-CM

## 2023-03-11 DIAGNOSIS — G43709 Chronic migraine without aura, not intractable, without status migrainosus: Secondary | ICD-10-CM

## 2023-03-11 MED ORDER — GADOPICLENOL 0.5 MMOL/ML IV SOLN: 8.0000 mL | Freq: Once | INTRAVENOUS | Status: DC | PRN

## 2023-03-19 NOTE — Progress Notes (Signed)
Patient advised.

## 2023-04-13 ENCOUNTER — Ambulatory Visit: Payer: BC Managed Care – PPO | Admitting: Physician Assistant

## 2023-04-21 ENCOUNTER — Ambulatory Visit: Payer: BC Managed Care – PPO | Admitting: Physician Assistant

## 2023-05-04 ENCOUNTER — Ambulatory Visit: Payer: BC Managed Care – PPO | Admitting: Physician Assistant

## 2023-05-04 ENCOUNTER — Encounter: Payer: Self-pay | Admitting: Physician Assistant

## 2023-05-04 DIAGNOSIS — F3281 Premenstrual dysphoric disorder: Secondary | ICD-10-CM | POA: Diagnosis not present

## 2023-05-04 DIAGNOSIS — F4323 Adjustment disorder with mixed anxiety and depressed mood: Secondary | ICD-10-CM

## 2023-05-04 DIAGNOSIS — F900 Attention-deficit hyperactivity disorder, predominantly inattentive type: Secondary | ICD-10-CM | POA: Diagnosis not present

## 2023-05-04 DIAGNOSIS — F840 Autistic disorder: Secondary | ICD-10-CM

## 2023-05-04 MED ORDER — METHYLPHENIDATE HCL ER (OSM) 27 MG PO TBCR: 27.0000 mg | EXTENDED_RELEASE_TABLET | ORAL | 0 refills | Status: DC

## 2023-05-04 NOTE — Progress Notes (Signed)
[facility]  Patient ID: Amber Watts,  MRN: [id number]  PCP: Dulce Sellar, NP  Date of Evaluation: 05/04/2023 Time spent:20 minutes  Chief Complaint:  Chief Complaint   ADD; Follow-up    HISTORY/CURRENT STATUS: HPI For routine med check  Hasn't been able to get the [place]. Pharmacy says she can't use the copay card given at the time she started it. When she went to [place] back in the early summer, she reported it didn't work.  Looking back she still does not feel like it was beneficial at all.  She has a hard time focusing, has missed work twice because she did not have the energy or motivation to get up and go.  She does not feel depressed however she does not feel like doing her homework or things that she needs to do.  States she does many things instead of that.  No extreme sadness, tearfulness, or feelings of hopelessness.  Sleeps well most of the time. ADLs are normal.  Sometimes her personal hygiene suffers because she does not feel like getting up and doing anything.    Appetite has not changed.  Weight is stable.  Denies laxative use, calorie restricting, or binging and purging.   Denies cutting or any form of self-harm.  No panic attacks, no complaints of anxiety in general.  Denies suicidal or homicidal thoughts.  Patient denies increased energy with decreased need for sleep, increased talkativeness, racing thoughts, impulsivity or risky behaviors, increased spending, increased libido, grandiosity, increased irritability or anger, paranoia, or hallucinations.  Denies dizziness, syncope, seizures, numbness, tingling, tremor, tics, unsteady gait, slurred speech, confusion. Denies muscle or joint pain, stiffness, or dystonia.  Individual Medical History/ Review of Systems: Changes? :No   Past medications for mental health diagnoses include: Prozac, Lexapro, Focalin XR caused tremor in hands, Wellbutrin was ineffective, Hydroxyzine was ineffective for sleep,  Buspar, Zoloft premenstrually, Vyvanse wasn't helpful, Adderall IR didn't work  Allergies: Patient has no known allergies.  Current Medications:  Current Outpatient Medications:    methylphenidate (CONCERTA) 27 MG PO CR tablet, Take 1 tablet (27 mg total) by mouth every morning., Disp: 30 tablet, Rfl: 0   Acetylcysteine 600 MG CAPS, Take 1 capsule (600 mg total) by mouth 2 (two) times daily. (Patient not taking: Reported on 02/19/2023), Disp: , Rfl: 0   b complex vitamins capsule, Take 1 capsule by mouth daily., Disp: , Rfl:    cyclobenzaprine (FLEXERIL) 5 MG tablet, Take 1-2 tablets (5-10 mg total) by mouth 3 (three) times daily as needed for muscle spasms (Headaches)., Disp: 30 tablet, Rfl: 2   DULoxetine (CYMBALTA) 60 MG capsule, Take 2 capsules (120 mg total) by mouth daily., Disp: 180 capsule, Rfl: 3   Fremanezumab-vfrm (AJOVY) 225 MG/1.5ML SOAJ, Inject 1.5 mLs into the skin every 28 (twenty-eight) days., Disp: 1.5 mL, Rfl: 11   naproxen (NAPROSYN) 500 MG tablet, Take 1 tablet (500 mg total) by mouth 2 (two) times daily with a meal. Take 1 tab at first sign of headache along with the Cyclobenzaprine (Flexeril), Disp: 15 tablet, Rfl: 2   propranolol (INDERAL) 10 MG tablet, Take 2 tablets (20 mg total) by mouth 2 (two) times daily. For headache prevention. OK to increase to 3 times daily if needed., Disp: 358 tablet, Rfl: 0   sertraline (ZOLOFT) 25 MG tablet, BEGIN 5 DAYS BEFORE MENSES BEGINS AND DAYS 1 AND 2 OF CYCLE, THEN STOP, Disp: 90 tablet, Rfl: 1   SUMAtriptan (IMITREX) 100 MG tablet, Take 1 tablet (  100 mg total) by mouth as needed for up to 1 dose for migraine. May repeat in 2 hours if headache persists or recurs.  Maximum 2 tablets in 24 hours., Disp: 10 tablet, Rfl: 5   traZODone (DESYREL) 100 MG tablet, Take 1-2 tablets (100-200 mg total) by mouth at bedtime as needed for sleep., Disp: 180 tablet, Rfl: 3 Medication Side Effects: none  Family Medical/ Social History: Changes?  No  MENTAL HEALTH EXAM:  There were no vitals taken for this visit.There is no height or weight on file to calculate BMI.  General Appearance: Casual, Well Groomed, and Obese  Eye Contact:  Good  Speech:  Clear and Coherent and Normal Rate  Volume:  Normal  Mood:  Euthymic  Affect:  Congruent  Thought Process:  Goal Directed and Descriptions of Associations: Circumstantial  Orientation:  Full (Time, Place, and Person)  Thought Content: Logical   Suicidal Thoughts:  No  Homicidal Thoughts:  No  Memory:  WNL  Judgement:  Good  Insight:  Good  Psychomotor Activity:  Normal  Concentration:  Concentration: Fair and Attention Span: Fair  Recall:  Good  Fund of Knowledge: Good  Language: Good  Assets:  Communication Skills Desire for Improvement Financial Resources/Insurance Housing Physical Health Resilience Transportation Vocational/Educational  ADL's:  Intact  Cognition: WNL  Prognosis:  Good   DIAGNOSES:    ICD-10-CM   1. Attention deficit hyperactivity disorder (ADHD), predominantly inattentive type  F90.0     2. Situational mixed anxiety and depressive disorder  F43.23     3. PMDD (premenstrual dysphoric disorder)  F32.81     4. Autism  F84.0      Receiving Psychotherapy: Yes    [facility] in [place], was referred there by [facility] d/t ins and the fact that they specialize in ASD in adults.  RECOMMENDATIONS:  PDMP was reviewed.  Jornay filled 12/12/2022. I provided 20 minutes of face to face time during this encounter, including time spent before and after the visit in records review, medical decision making, counseling pertinent to today's visit, and charting.   We discussed her symptoms.  I am not sure what the issue is with Ophelia Charter PM, it would likely work better at a higher dose, however the insurance/co-pay if she is a burden.  At this point I recommend changing to Concerta.  Benefits, risk and side effects were discussed and she  accepts.  Continue NAC 600 mg, 1 p.o. twice daily. Continue Cymbalta 60 mg, 2 p.o. daily. Start Concerta 27 mg, 1 p.o. every morning. Continue Propranolol 10 mg, 2 bid prn.  Continue Zoloft 25 mg p.o. daily beginning 5 days prior to menses and then days 1 and 2 of the cycle and then stop it. Continue  trazodone 100 mg,  1-2 p.o. nightly as needed sleep..  Continue multivitamin, B complex, and iron. Continue therapy. Return in 4 weeks.  [name], PA-C

## 2023-05-08 ENCOUNTER — Other Ambulatory Visit: Payer: Self-pay | Admitting: Family

## 2023-05-08 DIAGNOSIS — G44029 Chronic cluster headache, not intractable: Secondary | ICD-10-CM

## 2023-06-03 ENCOUNTER — Ambulatory Visit: Payer: BC Managed Care – PPO | Admitting: Physician Assistant

## 2023-06-03 ENCOUNTER — Encounter: Payer: Self-pay | Admitting: Physician Assistant

## 2023-06-03 DIAGNOSIS — F4323 Adjustment disorder with mixed anxiety and depressed mood: Secondary | ICD-10-CM | POA: Diagnosis not present

## 2023-06-03 DIAGNOSIS — F3281 Premenstrual dysphoric disorder: Secondary | ICD-10-CM

## 2023-06-03 DIAGNOSIS — F840 Autistic disorder: Secondary | ICD-10-CM

## 2023-06-03 DIAGNOSIS — F900 Attention-deficit hyperactivity disorder, predominantly inattentive type: Secondary | ICD-10-CM | POA: Diagnosis not present

## 2023-06-03 MED ORDER — METHYLPHENIDATE HCL ER (OSM) 36 MG PO TBCR: 36.0000 mg | EXTENDED_RELEASE_TABLET | Freq: Every day | ORAL | 0 refills | Status: DC

## 2023-06-03 NOTE — Progress Notes (Signed)
[facility]  Patient ID: Amber Watts,  MRN: [id number]  PCP: Dulce Sellar, NP  Date of Evaluation: 06/03/2023 Time spent:20 minutes  Chief Complaint:  Chief Complaint   ADD; Depression; Follow-up    HISTORY/CURRENT STATUS: HPI For routine med check  Concerta was started a month ago.  Patient states it is working really well.  She has done more homework than she ever has.  She is much more able to focus.  However she thinks it might be able to work even more, if possible she would like to increase the dose.  She has not had any side effects from the Concerta.  Patient is able to enjoy things.  Energy and motivation are good.  No extreme sadness, tearfulness, or feelings of hopelessness.  Sleeps well most of the time. ADLs and personal hygiene are normal.   Appetite has not changed.  Weight is stable.  No complaints of anxiety.  Denies suicidal or homicidal thoughts.  Patient denies increased energy with decreased need for sleep, increased talkativeness, racing thoughts, impulsivity or risky behaviors, increased spending, increased libido, grandiosity, increased irritability or anger, paranoia, or hallucinations.  Denies dizziness, syncope, seizures, numbness, tingling, tremor, tics, unsteady gait, slurred speech, confusion. Denies muscle or joint pain, stiffness, or dystonia.  Individual Medical History/ Review of Systems: Changes? :No   Past medications for mental health diagnoses include: Prozac, Lexapro, Focalin XR caused tremor in hands, Wellbutrin was ineffective, Hydroxyzine was ineffective for sleep, Buspar, Zoloft premenstrually, Vyvanse wasn't helpful, Adderall IR didn't work  Allergies: Patient has no known allergies.  Current Medications:  Current Outpatient Medications:    b complex vitamins capsule, Take 1 capsule by mouth daily., Disp: , Rfl:    DULoxetine (CYMBALTA) 60 MG capsule, Take 2 capsules (120 mg total) by mouth daily., Disp: 180 capsule,  Rfl: 3   Fremanezumab-vfrm (AJOVY) 225 MG/1.5ML SOAJ, Inject 1.5 mLs into the skin every 28 (twenty-eight) days., Disp: 1.5 mL, Rfl: 11   methylphenidate (CONCERTA) 36 MG PO CR tablet, Take 1 tablet (36 mg total) by mouth daily., Disp: 30 tablet, Rfl: 0   naproxen (NAPROSYN) 500 MG tablet, Take 1 tablet (500 mg total) by mouth 2 (two) times daily with a meal. Take 1 tab at first sign of headache along with the Cyclobenzaprine (Flexeril), Disp: 15 tablet, Rfl: 2   propranolol (INDERAL) 10 MG tablet, TAKE 2 TABLETS BY MOUTH 2 TIMES DAILY. FOR HEADACHE PREVENTION.INCREASE TO 3 TIMES DAILY IF NEEDED., Disp: 358 tablet, Rfl: 0   sertraline (ZOLOFT) 25 MG tablet, BEGIN 5 DAYS BEFORE MENSES BEGINS AND DAYS 1 AND 2 OF CYCLE, THEN STOP, Disp: 90 tablet, Rfl: 1   traZODone (DESYREL) 100 MG tablet, Take 1-2 tablets (100-200 mg total) by mouth at bedtime as needed for sleep., Disp: 180 tablet, Rfl: 3   Acetylcysteine 600 MG CAPS, Take 1 capsule (600 mg total) by mouth 2 (two) times daily. (Patient not taking: Reported on 02/19/2023), Disp: , Rfl: 0   cyclobenzaprine (FLEXERIL) 5 MG tablet, Take 1-2 tablets (5-10 mg total) by mouth 3 (three) times daily as needed for muscle spasms (Headaches). (Patient not taking: Reported on 06/03/2023), Disp: 30 tablet, Rfl: 2   SUMAtriptan (IMITREX) 100 MG tablet, Take 1 tablet (100 mg total) by mouth as needed for up to 1 dose for migraine. May repeat in 2 hours if headache persists or recurs.  Maximum 2 tablets in 24 hours. (Patient not taking: Reported on 06/03/2023), Disp: 10 tablet, Rfl: 5 Medication Side  Effects: none  Family Medical/ Social History: Changes? No  MENTAL HEALTH EXAM:  There were no vitals taken for this visit.There is no height or weight on file to calculate BMI.  General Appearance: Casual, Well Groomed, and Obese  Eye Contact:  Good  Speech:  Clear and Coherent and Normal Rate  Volume:  Normal  Mood:  Euthymic  Affect:  Congruent  Thought Process:   Goal Directed and Descriptions of Associations: Circumstantial  Orientation:  Full (Time, Place, and Person)  Thought Content: Logical   Suicidal Thoughts:  No  Homicidal Thoughts:  No  Memory:  WNL  Judgement:  Good  Insight:  Good  Psychomotor Activity:  Normal  Concentration:  Concentration: Fair and Attention Span: Fair  Recall:  Good  Fund of Knowledge: Good  Language: Good  Assets:  Desire for Improvement Financial Resources/Insurance Housing Physical Health Resilience Transportation  ADL's:  Intact  Cognition: WNL  Prognosis:  Good   DIAGNOSES:    ICD-10-CM   1. Attention deficit hyperactivity disorder (ADHD), predominantly inattentive type  F90.0     2. Situational mixed anxiety and depressive disorder  F43.23     3. PMDD (premenstrual dysphoric disorder)  F32.81     4. Autism  F84.0       Receiving Psychotherapy: Yes    [facility] in [place], was referred there by [facility] d/t ins and the fact that they specialize in ASD in adults.  RECOMMENDATIONS:  PDMP was reviewed.  Concerta filled 05/04/2023. I provided  20 minutes of face to face time during this encounter, including time spent before and after the visit in records review, medical decision making, counseling pertinent to today's visit, and charting.   She has responded well to the Concerta but I agree that the dose needs to be increased.  Continue Cymbalta 60 mg, 2 p.o. daily. Increase Concerta to 36 mg, 1 p.o. every morning. Continue Propranolol 10 mg, 2 bid prn.  Continue Zoloft 25 mg p.o. daily beginning 5 days prior to menses and then days 1 and 2 of the cycle and then stop it. Continue  trazodone 100 mg,  1-2 p.o. nightly as needed sleep..  Continue multivitamin, B complex, and iron. Continue therapy. Return in 2 months.  [name], PA-C

## 2023-06-10 ENCOUNTER — Telehealth: Payer: Self-pay | Admitting: Physician Assistant

## 2023-06-10 NOTE — Telephone Encounter (Signed)
Pt called and said that the pharmacy will not fill her cymbalta until 12/20. [place] is leaving [place] go [place]. I told her she could call pharmacy and pay for it out of pocket. Please call her at [phone]

## 2023-06-10 NOTE — Telephone Encounter (Signed)
Told patient she could get RF but that insurance not likely to cover it. Patient will call the pharmacy and let us know if any problems.

## 2023-06-11 ENCOUNTER — Telehealth: Payer: Self-pay

## 2023-06-25 ENCOUNTER — Other Ambulatory Visit: Payer: Self-pay | Admitting: Medical Genetics

## 2023-06-26 ENCOUNTER — Other Ambulatory Visit: Payer: Self-pay

## 2023-06-30 ENCOUNTER — Other Ambulatory Visit
Admission: RE | Admit: 2023-06-30 | Discharge: 2023-06-30 | Disposition: A | Discharge status: Home / Self Care | Payer: Self-pay | Source: Ambulatory Visit | Attending: Medical Genetics | Admitting: Medical Genetics

## 2023-07-13 ENCOUNTER — Other Ambulatory Visit: Payer: Self-pay | Admitting: Physician Assistant

## 2023-07-13 LAB — GENECONNECT MOLECULAR SCREEN: Genetic Analysis Overall Interpretation: NEGATIVE

## 2023-07-13 NOTE — Telephone Encounter (Signed)
 Pt called and said she only needs her concerta 36 mg. The other meds that the pharmacy sent she doesn't need

## 2023-07-14 MED ORDER — METHYLPHENIDATE HCL ER (OSM) 36 MG PO TBCR: 36.0000 mg | EXTENDED_RELEASE_TABLET | Freq: Every day | ORAL | 0 refills | Status: DC

## 2023-08-02 ENCOUNTER — Other Ambulatory Visit: Payer: Self-pay | Admitting: Family

## 2023-08-02 DIAGNOSIS — G44029 Chronic cluster headache, not intractable: Secondary | ICD-10-CM

## 2023-08-05 ENCOUNTER — Ambulatory Visit: Payer: BC Managed Care – PPO | Admitting: Physician Assistant

## 2023-08-26 NOTE — Progress Notes (Deleted)
 NEUROLOGY FOLLOW UP OFFICE NOTE  Amber Watts [id number]  Assessment/Plan:   Chronic migraine without aura, without status migrainosus, not intractable Near syncope    Migraine prevention:  Ajovy every 28 days Migraine rescue:  Sumatriptan 100mg .  She may still use Nurtec if needed (first or second line). *** Limit use of pain relievers to no more than 2 days out of week to prevent risk of rebound or medication-overuse headache. Keep headache diary Follow up 6 months. ***    Subjective:  Amber Watts is a 23 year old right-handed female with ADHD, Autism spectrum disorder, depression and anxiety who follows up for headaches and syncope.  UPDATE: Because she was noted to have saccades on eye tracking, MRI of brain without contrast (unable to obtain stick for contrast) was performed on 03/11/2023, which was personally reviewed and was normal but did show severe left sphenoid sinus mucosal thickening.    Switched from [employer] to [employer] Intensity:  *** Duration:  *** with sumatriptan Frequency:  *** Frequency of abortive medication: *** Current NSAIDS/analgesics:  ibuprofen 800mg , Tylenol Current triptans:  sumatriptan 100mg  Current ergotamine:  none Current anti-emetic:  none Current muscle relaxants:  cyclobenzaprine 5-10mg  TID PRN Current Antihypertensive medications:  propranolol 20mg  BID - TID PRN Current Antidepressant medications:  duloxetine 120mg  daily, sertraline 25mg  (perimenstrual prophylaxis), trazodone 100-200mg  at bedtime PRN (insomnia) Current Anticonvulsant medications:  none Current anti-CGRP:  Ajovy, Nurtec PRN Current Vitamins/Herbal/Supplements:  none Current Antihistamines/Decongestants:  none Other therapy:  none Birth control:  none    Caffeine:  Dr. Reino Kent.  No coffee Alcohol:  No Smoking:  No Diet:  32 oz water daily.  Does not skip meals. Exercise:  difficult due to lack of energy Depression:  stable but mostly feels fatigued; Anxiety:   improved overall Sleep hygiene:  Improved with trazodone.  Sometimes stills wake up in the middle of the night.  HISTORY: She has had headaches since childhood.  Became more severe around age 45.  Starts with dull bilateral retro-orbital, jaw or neck pain.  Headache is moderate (sometimes severe) non-throbbing bifrontal pain.  Associated with nausea, photophobia, phonophobia, osmophobia, blurred vision, one pupil may be dilated.  She denies vomiting, lacrimation, rhinorrhea, ptosis, unilateral numbness or weakness.  Lasts all day (either continuously or off and on for 3-4 hours).  Occurs 2-3 times a week (5-6 times a week prior to [employer]).  Hunger or smells of lotions, soap or perfumes may be a trigger.    She has history of presyncope since around 23 years old.  Feels lightheaded, vision starts to jitter, sweating, feels hot, nausea, and palpitations.  No associated tunnel vision, chest pain and never lost consciousness.  Occurs spontaneously (such as while sitting) but typically notices it with prolonged standing.  Occurs alone but may occur with headaches.  Lasts few minutes.  May occur anywhere from once a week to 6-7 times a week.  Takes propranolol as needed for palpitations/tachycardia.  Not helpful for headaches.  She reports no cardiac workup.    Past NSAIDS/analgesics:  naproxen Past abortive triptans:  none Past abortive ergotamine:  none Past muscle relaxants:  none Past anti-emetic:  Zofran ODT 8mg  Past antihypertensive medications:  none Past antidepressant medications:  Wellbutrin, fluoxetine Past anticonvulsant medications:  none Past anti-CGRP:  Emgality Past vitamins/Herbal/Supplements:  none Past antihistamines/decongestants:  none Other past therapies:  none   Family history of headache:  sister, father  PAST MEDICAL HISTORY: Past Medical History:  Diagnosis Date   ADHD (attention  deficit hyperactivity disorder)    Anxiety    Autistic disorder    Depression     Dysmenorrhea 05/10/2020   Persistent headaches 01/01/2022   Viral upper respiratory tract infection 04/17/2020    MEDICATIONS: Current Outpatient Medications on File Prior to Visit  Medication Sig Dispense Refill   Acetylcysteine 600 MG CAPS Take 1 capsule (600 mg total) by mouth 2 (two) times daily. (Patient not taking: Reported on 02/19/2023)  0   b complex vitamins capsule Take 1 capsule by mouth daily.     cyclobenzaprine (FLEXERIL) 5 MG tablet Take 1-2 tablets (5-10 mg total) by mouth 3 (three) times daily as needed for muscle spasms (Headaches). (Patient not taking: Reported on 06/03/2023) 30 tablet 2   DULoxetine (CYMBALTA) 60 MG capsule Take 2 capsules (120 mg total) by mouth daily. 180 capsule 3   Fremanezumab-vfrm (AJOVY) 225 MG/1.5ML SOAJ Inject 1.5 mLs into the skin every 28 (twenty-eight) days. 1.5 mL 11   methylphenidate (CONCERTA) 36 MG PO CR tablet Take 1 tablet (36 mg total) by mouth daily. 30 tablet 0   naproxen (NAPROSYN) 500 MG tablet Take 1 tablet (500 mg total) by mouth 2 (two) times daily with a meal. Take 1 tab at first sign of headache along with the Cyclobenzaprine (Flexeril) 15 tablet 2   propranolol (INDERAL) 10 MG tablet TAKE 2 TABLETS BY MOUTH 2 TIMES DAILY. FOR HEADACHE PREVENTION.INCREASE TO 3 TIMES DAILY IF NEEDED. 358 tablet 0   sertraline (ZOLOFT) 25 MG tablet BEGIN 5 DAYS BEFORE MENSES BEGINS AND DAYS 1 AND 2 OF CYCLE, THEN STOP 90 tablet 1   SUMAtriptan (IMITREX) 100 MG tablet Take 1 tablet (100 mg total) by mouth as needed for up to 1 dose for migraine. May repeat in 2 hours if headache persists or recurs.  Maximum 2 tablets in 24 hours. (Patient not taking: Reported on 06/03/2023) 10 tablet 5   traZODone (DESYREL) 100 MG tablet Take 1-2 tablets (100-200 mg total) by mouth at bedtime as needed for sleep. 180 tablet 3   No current facility-administered medications on file prior to visit.    ALLERGIES: No Known Allergies  FAMILY HISTORY: Family History   Problem Relation Age of Onset   Depression Mother    Breast cancer Mother    Diabetes Father    Carpal tunnel syndrome Father    Anxiety disorder Father    Migraines Sister    Asthma Sister    Anxiety disorder Sister    Stroke Maternal Grandfather    Heart disease Maternal Grandfather    Heart attack Paternal Grandmother    Dementia Paternal Grandfather       Objective:  *** General: No acute distress.  Patient appears ***-groomed.   Head:  Normocephalic/atraumatic Eyes:  Fundi examined but not visualized Neck: supple, no paraspinal tenderness, full range of motion Heart:  Regular rate and rhythm Lungs:  Clear to auscultation bilaterally Back: No paraspinal tenderness Neurological Exam: alert and oriented.  Speech fluent and not dysarthric, language intact.  CN II-XII intact. Bulk and tone normal, muscle strength 5/5 throughout.  Sensation to light touch intact.  Deep tendon reflexes 2+ throughout, toes downgoing.  Finger to nose testing intact.  Gait normal, Romberg negative.   Shon Millet, DO  CC: ***

## 2023-08-28 ENCOUNTER — Ambulatory Visit: Payer: BC Managed Care – PPO | Admitting: Neurology

## 2023-09-10 ENCOUNTER — Other Ambulatory Visit: Payer: Self-pay | Admitting: Physician Assistant

## 2023-09-25 ENCOUNTER — Encounter: Payer: Self-pay | Admitting: Physician Assistant

## 2023-09-25 ENCOUNTER — Ambulatory Visit (INDEPENDENT_AMBULATORY_CARE_PROVIDER_SITE_OTHER): Payer: BC Managed Care – PPO | Admitting: Physician Assistant

## 2023-09-25 DIAGNOSIS — F900 Attention-deficit hyperactivity disorder, predominantly inattentive type: Secondary | ICD-10-CM | POA: Diagnosis not present

## 2023-09-25 DIAGNOSIS — G4700 Insomnia, unspecified: Secondary | ICD-10-CM

## 2023-09-25 DIAGNOSIS — F3281 Premenstrual dysphoric disorder: Secondary | ICD-10-CM

## 2023-09-25 DIAGNOSIS — F4323 Adjustment disorder with mixed anxiety and depressed mood: Secondary | ICD-10-CM

## 2023-09-25 DIAGNOSIS — F840 Autistic disorder: Secondary | ICD-10-CM

## 2023-09-25 MED ORDER — METHYLPHENIDATE HCL ER (OSM) 54 MG PO TBCR: 54.0000 mg | EXTENDED_RELEASE_TABLET | ORAL | 0 refills | Status: DC

## 2023-09-25 NOTE — Progress Notes (Signed)
 [facility]  Patient ID: Amber Watts,  MRN: [id number]  PCP: Dulce Sellar, NP  Date of Evaluation: 09/25/2023 Time spent:20 minutes  Chief Complaint:  Chief Complaint   Depression; Insomnia; ADD; Follow-up    HISTORY/CURRENT STATUS: HPI For routine med check  She has been on Concerta for approximately 4 months now.  It has been helping but feels like it could help even more.  She asked if we could increase the dose.  Patient is able to enjoy things.  Energy and motivation are good.  School is going well.  She graduates in a couple of months.  No extreme sadness, tearfulness, or feelings of hopelessness.  Sleeps well most of the time. ADLs and personal hygiene are normal.   Appetite has not changed.  Weight is stable.  Denies laxative use, calorie restricting, or binging and purging.   Denies cutting or any form of self-harm.  Anxiety is well controlled.  Denies suicidal or homicidal thoughts.  Patient denies increased energy with decreased need for sleep, increased talkativeness, racing thoughts, impulsivity or risky behaviors, increased spending, increased libido, grandiosity, increased irritability or anger, paranoia, or hallucinations.  Denies dizziness, syncope, seizures, numbness, tingling, tremor, tics, unsteady gait, slurred speech, confusion. Denies muscle or joint pain, stiffness, or dystonia.  Individual Medical History/ Review of Systems: Changes? :No   Past medications for mental health diagnoses include: Prozac, Lexapro, Focalin XR caused tremor in hands, Wellbutrin was ineffective, Hydroxyzine was ineffective for sleep, Buspar, Zoloft premenstrually, Vyvanse wasn't helpful, Adderall IR didn't work  Allergies: Patient has no known allergies.  Current Medications:  Current Outpatient Medications:    b complex vitamins capsule, Take 1 capsule by mouth daily., Disp: , Rfl:    DULoxetine (CYMBALTA) 60 MG capsule, TAKE 2 CAPSULES BY MOUTH DAILY, Disp:  180 capsule, Rfl: 0   Fremanezumab-vfrm (AJOVY) 225 MG/1.5ML SOAJ, Inject 1.5 mLs into the skin every 28 (twenty-eight) days., Disp: 1.5 mL, Rfl: 11   methylphenidate (CONCERTA) 54 MG PO CR tablet, Take 1 tablet (54 mg total) by mouth every morning., Disp: 30 tablet, Rfl: 0   naproxen (NAPROSYN) 500 MG tablet, Take 1 tablet (500 mg total) by mouth 2 (two) times daily with a meal. Take 1 tab at first sign of headache along with the Cyclobenzaprine (Flexeril), Disp: 15 tablet, Rfl: 2   propranolol (INDERAL) 10 MG tablet, TAKE 2 TABLETS BY MOUTH 2 TIMES DAILY. FOR HEADACHE PREVENTION.INCREASE TO 3 TIMES DAILY IF NEEDED., Disp: 358 tablet, Rfl: 0   sertraline (ZOLOFT) 25 MG tablet, BEGIN 5 DAYS BEFORE MENSES BEGINS AND DAYS 1 AND 2 OF CYCLE, THEN STOP, Disp: 90 tablet, Rfl: 1   Acetylcysteine 600 MG CAPS, Take 1 capsule (600 mg total) by mouth 2 (two) times daily. (Patient not taking: Reported on 09/25/2023), Disp: , Rfl: 0   cyclobenzaprine (FLEXERIL) 5 MG tablet, Take 1-2 tablets (5-10 mg total) by mouth 3 (three) times daily as needed for muscle spasms (Headaches). (Patient not taking: Reported on 09/25/2023), Disp: 30 tablet, Rfl: 2   SUMAtriptan (IMITREX) 100 MG tablet, Take 1 tablet (100 mg total) by mouth as needed for up to 1 dose for migraine. May repeat in 2 hours if headache persists or recurs.  Maximum 2 tablets in 24 hours. (Patient not taking: Reported on 09/25/2023), Disp: 10 tablet, Rfl: 5   traZODone (DESYREL) 100 MG tablet, TAKE 1-2 TABLETS (100-200 MG TOTAL) BY MOUTH AT BEDTIME AS NEEDED FOR SLEEP., Disp: 180 tablet, Rfl: 0 Medication Side Effects:  none  Family Medical/ Social History: Changes? No  MENTAL HEALTH EXAM:  There were no vitals taken for this visit.There is no height or weight on file to calculate BMI.  General Appearance: Casual, Well Groomed, and Obese  Eye Contact:  Good  Speech:  Clear and Coherent and Normal Rate  Volume:  Normal  Mood:  Euthymic  Affect:  Congruent   Thought Process:  Goal Directed and Descriptions of Associations: Circumstantial  Orientation:  Full (Time, Place, and Person)  Thought Content: Logical   Suicidal Thoughts:  No  Homicidal Thoughts:  No  Memory:  WNL  Judgement:  Good  Insight:  Good  Psychomotor Activity:  Normal  Concentration:  Concentration: Fair and Attention Span: Fair  Recall:  Good  Fund of Knowledge: Good  Language: Good  Assets:  Communication Skills Desire for Improvement Financial Resources/Insurance Housing Physical Health Resilience Transportation  ADL's:  Intact  Cognition: WNL  Prognosis:  Good   DIAGNOSES:    ICD-10-CM   1. Attention deficit hyperactivity disorder (ADHD), predominantly inattentive type  F90.0     2. Situational mixed anxiety and depressive disorder  F43.23     3. Insomnia, unspecified type  G47.00     4. PMDD (premenstrual dysphoric disorder)  F32.81     5. Autism  F84.0      Receiving Psychotherapy: Yes    [facility] in [place], was referred there by Kelton Pillar d/t ins and the fact that they specialize in ASD in adults.  RECOMMENDATIONS:  PDMP was reviewed.  Concerta filled 07/14/2023. I provided 20 minutes of face to face time during this encounter, including time spent before and after the visit in records review, medical decision making, counseling pertinent to today's visit, and charting.   It is appropriate to increase the Concerta.  No other changes in treatment.  Continue Cymbalta 60 mg, 2 p.o. daily. Increase Concerta to 54 mg, 1 p.o. every morning. Continue Propranolol 10 mg, 2 bid prn.  Continue Zoloft 25 mg p.o. daily beginning 5 days prior to menses and then days 1 and 2 of the cycle and then stop it. Continue  trazodone 100 mg,  1-2 p.o. nightly as needed sleep.  Continue multivitamin, B complex, and iron. Continue therapy. Return in 6 weeks.   [name], PA-C

## 2023-09-26 ENCOUNTER — Other Ambulatory Visit: Payer: Self-pay | Admitting: Physician Assistant

## 2023-10-28 NOTE — Telephone Encounter (Signed)
 No further review

## 2023-10-31 ENCOUNTER — Other Ambulatory Visit: Payer: Self-pay | Admitting: Family

## 2023-10-31 DIAGNOSIS — G44029 Chronic cluster headache, not intractable: Secondary | ICD-10-CM

## 2023-11-11 ENCOUNTER — Other Ambulatory Visit: Payer: Self-pay

## 2023-11-11 ENCOUNTER — Other Ambulatory Visit: Payer: Self-pay | Admitting: Family

## 2023-11-11 DIAGNOSIS — G44029 Chronic cluster headache, not intractable: Secondary | ICD-10-CM

## 2023-11-11 MED ORDER — PROPRANOLOL HCL 10 MG PO TABS: 10.0000 mg | ORAL_TABLET | Freq: Two times a day (BID) | ORAL | 0 refills | Status: DC

## 2023-11-19 ENCOUNTER — Ambulatory Visit: Admitting: Physician Assistant

## 2023-12-08 ENCOUNTER — Encounter: Payer: Self-pay | Admitting: Physician Assistant

## 2023-12-08 ENCOUNTER — Ambulatory Visit: Admitting: Physician Assistant

## 2023-12-08 VITALS — BP 117/80 | HR 82

## 2023-12-08 DIAGNOSIS — F4323 Adjustment disorder with mixed anxiety and depressed mood: Secondary | ICD-10-CM | POA: Diagnosis not present

## 2023-12-08 DIAGNOSIS — F900 Attention-deficit hyperactivity disorder, predominantly inattentive type: Secondary | ICD-10-CM

## 2023-12-08 DIAGNOSIS — F840 Autistic disorder: Secondary | ICD-10-CM

## 2023-12-08 DIAGNOSIS — F3281 Premenstrual dysphoric disorder: Secondary | ICD-10-CM

## 2023-12-08 MED ORDER — DULOXETINE HCL 60 MG PO CPEP: 120.0000 mg | ORAL_CAPSULE | Freq: Every day | ORAL | 0 refills | Status: DC

## 2023-12-08 MED ORDER — CLONIDINE HCL 0.1 MG PO TABS: 0.1000 mg | ORAL_TABLET | Freq: Every evening | ORAL | 1 refills | Status: DC

## 2023-12-08 NOTE — Progress Notes (Signed)
 [facility]  Patient ID: Amber Watts,  MRN: [id number]  PCP: Versa Gore, NP  Date of Evaluation: 12/08/2023 Time spent:20 minutes  Chief Complaint:  Chief Complaint   ADD; Depression; Insomnia; Follow-up    HISTORY/CURRENT STATUS: HPI For routine med check  She ran out of Concerta .  Didn't seem to be helping much anyway so she didn't call.  More anxious when she was on it.  Still needs something to help her focus.  Feels more anxious in general, even when not on the stimulant.  No PA. Energy and motivation are fair to good depending on the day.  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  Starts Grad school in the fall.  No extreme sadness, tearfulness, or feelings of hopelessness.  Sleeps well most of the time. ADLs and personal hygiene are normal.  Appetite has not changed.  Weight is stable.  Denies laxative use, calorie restricting, or binging and purging.   Denies cutting or any form of self-harm.  Denies suicidal or homicidal thoughts.  Patient denies increased energy with decreased need for sleep, increased talkativeness, racing thoughts, impulsivity or risky behaviors, increased spending, increased libido, grandiosity, increased irritability or anger, paranoia, or hallucinations.  Denies dizziness, syncope, seizures, numbness, tingling, tremor, tics, unsteady gait, slurred speech, confusion. Denies muscle or joint pain, stiffness, or dystonia.  Individual Medical History/ Review of Systems: Changes? :No   Past medications for mental health diagnoses include: Prozac , Lexapro, Wellbutrin  was ineffective, Hydroxyzine  was ineffective for sleep, Buspar, Zoloft  premenstrually  Focalin  XR caused tremor in hands, Vyvanse  wasn't helpful, Adderall IR didn't work, Concerta  was minimally effective, Jornay didn't help  Allergies: Patient has no known allergies.  Current Medications:  Current Outpatient Medications:     Acetylcysteine  600 MG CAPS, Take 1 capsule (600 mg total) by mouth 2 (two) times daily., Disp: , Rfl: 0   b complex vitamins capsule, Take 1 capsule by mouth daily., Disp: , Rfl:    cloNIDine (CATAPRES) 0.1 MG tablet, Take 1 tablet (0.1 mg total) by mouth at bedtime., Disp: 30 tablet, Rfl: 1   Fremanezumab -vfrm (AJOVY ) 225 MG/1.5ML SOAJ, Inject 1.5 mLs into the skin every 28 (twenty-eight) days., Disp: 1.5 mL, Rfl: 11   sertraline  (ZOLOFT ) 25 MG tablet, BEGIN 5 DAYS BEFORE MENSES BEGINS AND DAYS 1 AND 2 OF CYCLE, THEN STOP, Disp: 90 tablet, Rfl: 1   traZODone  (DESYREL ) 100 MG tablet, TAKE 1-2 TABLETS (100-200 MG TOTAL) BY MOUTH AT BEDTIME AS NEEDED FOR SLEEP., Disp: 180 tablet, Rfl: 0   cyclobenzaprine  (FLEXERIL ) 5 MG tablet, Take 1-2 tablets (5-10 mg total) by mouth 3 (three) times daily as needed for muscle spasms (Headaches). (Patient not taking: Reported on 06/03/2023), Disp: 30 tablet, Rfl: 2   DULoxetine  (CYMBALTA ) 60 MG capsule, Take 2 capsules (120 mg total) by mouth daily., Disp: 180 capsule, Rfl: 0   naproxen  (NAPROSYN ) 500 MG tablet, Take 1 tablet (500 mg total) by mouth 2 (two) times daily with a meal. Take 1 tab at first sign of headache along with the Cyclobenzaprine  (Flexeril ) (Patient not taking: Reported on 12/08/2023), Disp: 15 tablet, Rfl: 2   propranolol  (INDERAL ) 10 MG tablet, Take 1 tablet (10 mg total) by mouth 2 (two) times daily. TAKE 2 TABLETS BY MOUTH 2 TIMES DAILY. FOR HEADACHE PREVENTION.INCREASE TO 3 TIMES DAILY IF NEEDED. (Patient not taking: Reported on 12/08/2023), Disp: 358 tablet, Rfl: 0   propranolol  (INDERAL ) 10 MG tablet, Take 1  tablet (10 mg total) by mouth 2 (two) times daily. TAKE 2 TABLETS BY MOUTH 2 TIMES DAILY. FOR HEADACHE PREVENTION.INCREASE TO 3 TIMES DAILY IF NEEDED. (Patient not taking: Reported on 12/08/2023), Disp: 358 tablet, Rfl: 0   SUMAtriptan  (IMITREX ) 100 MG tablet, Take 1 tablet (100 mg total) by mouth as needed for up to 1 dose for migraine. May  repeat in 2 hours if headache persists or recurs.  Maximum 2 tablets in 24 hours. (Patient not taking: Reported on 06/03/2023), Disp: 10 tablet, Rfl: 5 Medication Side Effects: none  Family Medical/ Social History: Changes? No  MENTAL HEALTH EXAM:  Blood pressure 117/80, pulse 82.There is no height or weight on file to calculate BMI.  General Appearance: Casual, Well Groomed, and Obese  Eye Contact:  Good  Speech:  Clear and Coherent and Normal Rate  Volume:  Normal  Mood:  Euthymic  Affect:  Flat  Thought Process:  Goal Directed and Descriptions of Associations: Circumstantial  Orientation:  Full (Time, Place, and Person)  Thought Content: Logical   Suicidal Thoughts:  No  Homicidal Thoughts:  No  Memory:  WNL  Judgement:  Good  Insight:  Good  Psychomotor Activity:  Normal  Concentration:  Concentration: Fair and Attention Span: Fair  Recall:  Good  Fund of Knowledge: Good  Language: Good  Assets:  Communication Skills Desire for Improvement Financial Resources/Insurance Housing Physical Health Resilience Transportation  ADL's:  Intact  Cognition: WNL  Prognosis:  Good   DIAGNOSES:    ICD-10-CM   1. Attention deficit hyperactivity disorder (ADHD), predominantly inattentive type  F90.0     2. Situational mixed anxiety and depressive disorder  F43.23     3. PMDD (premenstrual dysphoric disorder)  F32.81     4. Autism  F84.0       Receiving Psychotherapy: Yes    [facility] in [place], was referred there by Leanor Proper d/t ins and the fact that they specialize in ASD in adults.  RECOMMENDATIONS:  PDMP was reviewed.  Concerta  filled 09/25/2023. I provided 20 minutes of face to face time during this encounter, including time spent before and after the visit in records review, medical decision making, counseling pertinent to today's visit, and charting.   Discussed adding Clonidine for ADD, and can help anxiety. Benefits, risks, SE were discussed and she  accepts.   Start Clonidine 0.1 mg, 1 at bedtime.  Continue Cymbalta  60 mg, 2 p.o. daily. Continue Zoloft  25 mg p.o. daily beginning 5 days prior to menses and then days 1 and 2 of the cycle and then stop it. Continue  trazodone  100 mg,  1-2 p.o. nightly as needed sleep.  Continue multivitamin, B complex, and iron. Continue therapy. Return in 6-8 weeks.   Marvia Slocumb, PA-C

## 2023-12-09 NOTE — Progress Notes (Signed)
 NEUROLOGY FOLLOW UP OFFICE NOTE  Amber Watts [id number]  Assessment/Plan:   Migraine without aura, without status migrainosus, not intractable   Migraine prevention:  Ajovy   Migraine rescue:  sumatriptan  Limit use of pain relievers to no more than 9 days out of the month to prevent risk of rebound or medication-overuse headache. Keep headache diary Follow up 6 months.    Subjective:  Amber Watts is a 23 year old right-handed female with ADHD, Autism spectrum disorder, depression and anxiety who follows up for migraines.  MRI of brain personally reviewed.  UPDATE: Last seen in August. MRI of brain without contrast (IV access unable to be achieved) showed severe left sphenoid sinus mucosal thickening but normal brain.  At that time, changed from Emgality  to Ajovy .   Improved Intensity:  5/10 Duration:  1 hour with sumatriptan  Frequency:  4 days a month Frequency of abortive medication: 4 days a month Current NSAIDS/analgesics:  Tylenol  Current triptans:  none Current ergotamine:  none Current anti-emetic:  none Current muscle relaxants:  none Current Antihypertensive medications:  clonidine (for ADHD and anxiety) Current Antidepressant medications:  duloxetine  120mg  daily, sertraline  25mg  (perimenstrual prophylaxis), trazodone  100-200mg  at bedtime PRN (insomnia) Current Anticonvulsant medications:  none Current anti-CGRP:  Ajovy  Current Vitamins/Herbal/Supplements:  none Current Antihistamines/Decongestants:  none Other therapy:  none Birth control:  none    Caffeine:  Dr. Kathlene Paradise.  No coffee Alcohol:  No Smoking:  No Diet:  32 oz water daily.  Does not skip meals. Exercise:  difficult due to lack of energy Depression:  stable but mostly feels fatigued; Anxiety:  improved overall Sleep hygiene:  Improved with trazodone .  Sometimes stills wake up in the middle of the night.  HISTORY: Migraines: She has had headaches since childhood.  Became more severe  around age 40.  Starts with dull bilateral retro-orbital, jaw or neck pain.  Headache is moderate (sometimes severe) non-throbbing bifrontal pain.  Associated with nausea, photophobia, phonophobia, osmophobia, blurred vision, one pupil may be dilated.  She denies vomiting, lacrimation, rhinorrhea, ptosis, unilateral numbness or weakness.  Lasts all day (either continuously or off and on for 3-4 hours).  Occurs 2-3 times a week (5-6 times a week prior to Emgality ).  Hunger or smells of lotions, soap or perfumes may be a trigger.    Presyncope: She has history of presyncope since around 23 years old.  Feels lightheaded, vision starts to jitter, sweating, feels hot, nausea, and palpitations.  No associated tunnel vision, chest pain and never lost consciousness.  Occurs spontaneously (such as while sitting) but typically notices it with prolonged standing.  Occurs alone but may occur with headaches.  Lasts few minutes.  May occur anywhere from once a week to 6-7 times a week.  Takes propranolol  as needed for palpitations/tachycardia.  Not helpful for headaches.  She reports no cardiac workup.    Past NSAIDS/analgesics:  naproxen , ibuprofen  800mg  Past abortive triptans:  sumatriptan  100mg  Past abortive ergotamine:  none Past muscle relaxants:  cyclobenzaprine  5mg  Past anti-emetic:  Zofran  ODT 8mg  Past antihypertensive medications:  none Past antidepressant medications:  Wellbutrin , fluoxetine  Past anticonvulsant medications:  none Past anti-CGRP:  Emgality , Nurtec PRN Past vitamins/Herbal/Supplements:  none Past antihistamines/decongestants:  none Other past therapies:  none   Family history of headache:  sister, father  PAST MEDICAL HISTORY: Past Medical History:  Diagnosis Date   ADHD (attention deficit hyperactivity disorder)    Anxiety    Autistic disorder    Depression    Dysmenorrhea 05/10/2020  Persistent headaches 01/01/2022   Viral upper respiratory tract infection 04/17/2020     MEDICATIONS: Current Outpatient Medications on File Prior to Visit  Medication Sig Dispense Refill   Acetylcysteine  600 MG CAPS Take 1 capsule (600 mg total) by mouth 2 (two) times daily.  0   b complex vitamins capsule Take 1 capsule by mouth daily.     cloNIDine (CATAPRES) 0.1 MG tablet Take 1 tablet (0.1 mg total) by mouth at bedtime. 30 tablet 1   cyclobenzaprine  (FLEXERIL ) 5 MG tablet Take 1-2 tablets (5-10 mg total) by mouth 3 (three) times daily as needed for muscle spasms (Headaches). (Patient not taking: Reported on 06/03/2023) 30 tablet 2   DULoxetine  (CYMBALTA ) 60 MG capsule Take 2 capsules (120 mg total) by mouth daily. 180 capsule 0   Fremanezumab -vfrm (AJOVY ) 225 MG/1.5ML SOAJ Inject 1.5 mLs into the skin every 28 (twenty-eight) days. 1.5 mL 11   naproxen  (NAPROSYN ) 500 MG tablet Take 1 tablet (500 mg total) by mouth 2 (two) times daily with a meal. Take 1 tab at first sign of headache along with the Cyclobenzaprine  (Flexeril ) (Patient not taking: Reported on 12/08/2023) 15 tablet 2   propranolol  (INDERAL ) 10 MG tablet Take 1 tablet (10 mg total) by mouth 2 (two) times daily. TAKE 2 TABLETS BY MOUTH 2 TIMES DAILY. FOR HEADACHE PREVENTION.INCREASE TO 3 TIMES DAILY IF NEEDED. (Patient not taking: Reported on 12/08/2023) 358 tablet 0   propranolol  (INDERAL ) 10 MG tablet Take 1 tablet (10 mg total) by mouth 2 (two) times daily. TAKE 2 TABLETS BY MOUTH 2 TIMES DAILY. FOR HEADACHE PREVENTION.INCREASE TO 3 TIMES DAILY IF NEEDED. (Patient not taking: Reported on 12/08/2023) 358 tablet 0   sertraline  (ZOLOFT ) 25 MG tablet BEGIN 5 DAYS BEFORE MENSES BEGINS AND DAYS 1 AND 2 OF CYCLE, THEN STOP 90 tablet 1   SUMAtriptan  (IMITREX ) 100 MG tablet Take 1 tablet (100 mg total) by mouth as needed for up to 1 dose for migraine. May repeat in 2 hours if headache persists or recurs.  Maximum 2 tablets in 24 hours. (Patient not taking: Reported on 06/03/2023) 10 tablet 5   traZODone  (DESYREL ) 100 MG tablet  TAKE 1-2 TABLETS (100-200 MG TOTAL) BY MOUTH AT BEDTIME AS NEEDED FOR SLEEP. 180 tablet 0   No current facility-administered medications on file prior to visit.    ALLERGIES: No Known Allergies  FAMILY HISTORY: Family History  Problem Relation Age of Onset   Depression Mother    Breast cancer Mother    Diabetes Father    Carpal tunnel syndrome Father    Anxiety disorder Father    Migraines Sister    Asthma Sister    Anxiety disorder Sister    Stroke Maternal Grandfather    Heart disease Maternal Grandfather    Heart attack Paternal Grandmother    Dementia Paternal Grandfather       Objective:  Blood pressure 96/65, pulse 89, height 5' 1 (1.549 m), weight 181 lb (82.1 kg), SpO2 98%. General: No acute distress.  Patient appears well-groomed.   Head:  Normocephalic/atraumatic Eyes:  Fundi examined but not visualized Neck: supple, no paraspinal tenderness, full range of motion Heart:  Regular rate and rhythm Lungs:  Clear to auscultation bilaterally Back: No paraspinal tenderness Neurological Exam: alert and oriented.  Speech fluent and not dysarthric, language intact.  CN II-XII intact. Bulk and tone normal, muscle strength 5/5 throughout.  Sensation to light touch intact.  Deep tendon reflexes 2+ throughout, toes downgoing.  Finger  to nose testing intact.  Gait normal, Romberg negative.   Janne Members, DO  CC: Versa Gore, NP

## 2023-12-10 ENCOUNTER — Ambulatory Visit: Admitting: Neurology

## 2023-12-10 ENCOUNTER — Encounter: Payer: Self-pay | Admitting: Neurology

## 2023-12-10 VITALS — BP 96/65 | HR 89 | Ht 61.0 in | Wt 181.0 lb

## 2023-12-10 DIAGNOSIS — G43009 Migraine without aura, not intractable, without status migrainosus: Secondary | ICD-10-CM | POA: Diagnosis not present

## 2023-12-10 MED ORDER — AJOVY 225 MG/1.5ML ~~LOC~~ SOAJ: 1.5000 mL | SUBCUTANEOUS | 11 refills | Status: DC

## 2023-12-10 MED ORDER — SUMATRIPTAN SUCCINATE 100 MG PO TABS: 100.0000 mg | ORAL_TABLET | ORAL | 5 refills | Status: AC | PRN

## 2023-12-10 NOTE — Patient Instructions (Signed)
 Ajovy  Sumatriptan  as needed/directed

## 2023-12-31 ENCOUNTER — Other Ambulatory Visit: Payer: Self-pay | Admitting: Physician Assistant

## 2024-01-29 ENCOUNTER — Other Ambulatory Visit: Payer: Self-pay | Admitting: Physician Assistant

## 2024-02-26 ENCOUNTER — Telehealth (INDEPENDENT_AMBULATORY_CARE_PROVIDER_SITE_OTHER): Admitting: Physician Assistant

## 2024-02-26 ENCOUNTER — Encounter: Payer: Self-pay | Admitting: Physician Assistant

## 2024-02-26 DIAGNOSIS — F900 Attention-deficit hyperactivity disorder, predominantly inattentive type: Secondary | ICD-10-CM

## 2024-02-26 DIAGNOSIS — F3281 Premenstrual dysphoric disorder: Secondary | ICD-10-CM

## 2024-02-26 DIAGNOSIS — F4323 Adjustment disorder with mixed anxiety and depressed mood: Secondary | ICD-10-CM | POA: Diagnosis not present

## 2024-02-26 DIAGNOSIS — F840 Autistic disorder: Secondary | ICD-10-CM | POA: Diagnosis not present

## 2024-02-26 MED ORDER — TRAZODONE HCL 100 MG PO TABS: 100.0000 mg | ORAL_TABLET | Freq: Every evening | ORAL | 3 refills | Status: DC | PRN

## 2024-02-26 MED ORDER — DULOXETINE HCL 60 MG PO CPEP: 120.0000 mg | ORAL_CAPSULE | Freq: Every day | ORAL | 3 refills | Status: AC

## 2024-02-26 MED ORDER — CLONIDINE HCL 0.2 MG PO TABS: 0.2000 mg | ORAL_TABLET | Freq: Every evening | ORAL | 2 refills | Status: DC

## 2024-02-26 NOTE — Progress Notes (Signed)
 [facility]  Patient ID: Amber Watts,  MRN: [id number]  PCP: Lucius Krabbe, NP  Date of Evaluation: 02/26/2024 Time spent:20 minutes  Chief Complaint:  Chief Complaint   Depression; ADD; Follow-up    Virtual Visit via Telehealth  I connected with patient by a video enabled telemedicine application with their informed consent, and verified patient privacy and that I am speaking with the correct person using two identifiers.  I am private, in my office and the patient is at work.  I discussed the limitations, risks, security and privacy concerns of performing an evaluation and management service by video and the availability of in person appointments. I also discussed with the patient that there may be a patient responsible charge related to this service. The patient expressed understanding and agreed to proceed.   I discussed the assessment and treatment plan with the patient. The patient was provided an opportunity to ask questions and all were answered. The patient agreed with the plan and demonstrated an understanding of the instructions.   The patient was advised to call back or seek an in-person evaluation if the symptoms worsen or if the condition fails to improve as anticipated.  I provided approximately 20   minutes of non-face-to-face time during this encounter.  HISTORY/CURRENT STATUS: HPI For routine med check  We started Clonidine  at the LOV for ADD. It's helped w/ the focus/concentration at least 50%.  Wonders if increasing the dose is possible.  No dizziness, syncope.  Patient is able to enjoy things.  Energy and motivation are good.  Work is going well.  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   No extreme sadness, tearfulness, or feelings of hopelessness.  Sleeps well most of the time. ADLs and personal hygiene are normal.  Appetite has not changed.  Weight is stable.  No complaints of anxiety unless something happens that anyone would be anxious  about.  No mania, delirium, AH/VH.  No SI/HI.  Individual Medical History/ Review of Systems: Changes? :No   Past medications for mental health diagnoses include: Prozac , Lexapro, Wellbutrin  was ineffective, Hydroxyzine  was ineffective for sleep, Buspar, Zoloft  premenstrually  Focalin  XR caused tremor in hands, Vyvanse  wasn't helpful, Adderall IR didn't work, Concerta  was minimally effective, [place] didn't help  Allergies: Patient has no known allergies.  Current Medications:  Current Outpatient Medications:    Acetylcysteine  600 MG CAPS, Take 1 capsule (600 mg total) by mouth 2 (two) times daily., Disp: , Rfl: 0   b complex vitamins capsule, Take 1 capsule by mouth daily., Disp: , Rfl:    cloNIDine  (CATAPRES ) 0.2 MG tablet, Take 1 tablet (0.2 mg total) by mouth at bedtime., Disp: 30 tablet, Rfl: 2   Fremanezumab -vfrm (AJOVY ) 225 MG/1.5ML SOAJ, Inject 1.5 mLs into the skin every 28 (twenty-eight) days., Disp: 1.5 mL, Rfl: 11   naproxen  (NAPROSYN ) 500 MG tablet, Take 1 tablet (500 mg total) by mouth 2 (two) times daily with a meal. Take 1 tab at first sign of headache along with the Cyclobenzaprine  (Flexeril ), Disp: 15 tablet, Rfl: 2   sertraline  (ZOLOFT ) 25 MG tablet, BEGIN 5 DAYS BEFORE MENSES BEGINS AND DAYS 1 AND 2 OF CYCLE, THEN STOP, Disp: 90 tablet, Rfl: 1   SUMAtriptan  (IMITREX ) 100 MG tablet, Take 1 tablet (100 mg total) by mouth as needed for up to 1 dose for migraine. May repeat in 2 hours if headache persists or recurs.  Maximum 2 tablets in 24 hours., Disp: 10 tablet, Rfl: 5  DULoxetine  (CYMBALTA ) 60 MG capsule, Take 2 capsules (120 mg total) by mouth daily., Disp: 180 capsule, Rfl: 3   traZODone  (DESYREL ) 100 MG tablet, Take 1-2 tablets (100-200 mg total) by mouth at bedtime as needed for sleep., Disp: 180 tablet, Rfl: 3 Medication Side Effects: none  Family Medical/ Social History: Changes? No  MENTAL HEALTH EXAM:  There were no vitals taken for this visit.There is no  height or weight on file to calculate BMI.  General Appearance: Casual, Well Groomed, and Obese  Eye Contact:  Good  Speech:  Clear and Coherent and Normal Rate  Volume:  Normal  Mood:  Euthymic  Affect:  Flat  Thought Process:  Goal Directed and Descriptions of Associations: Circumstantial  Orientation:  Full (Time, Place, and Person)  Thought Content: Logical   Suicidal Thoughts:  No  Homicidal Thoughts:  No  Memory:  WNL  Judgement:  Good  Insight:  Good  Psychomotor Activity:  Normal  Concentration:  Concentration: Good and Attention Span: Fair  Recall:  Good  Fund of Knowledge: Good  Language: Good  Assets:  Communication Skills Desire for Improvement Financial Resources/Insurance Housing Physical Health Resilience Transportation  ADL's:  Intact  Cognition: WNL  Prognosis:  Good   DIAGNOSES:    ICD-10-CM   1. Attention deficit hyperactivity disorder (ADHD), predominantly inattentive type  F90.0     2. Situational mixed anxiety and depressive disorder  F43.23     3. PMDD (premenstrual dysphoric disorder)  F32.81     4. Autism  F84.0       Receiving Psychotherapy: Yes    [facility] in [place], was referred there by [facility] d/t ins and the fact that they specialize in ASD in adults.  RECOMMENDATIONS:  PDMP was reviewed.  Concerta  filled 09/25/2023. I provided approximately 20 minutes of non-face to face time during this encounter, including time spent before and after the visit in records review, medical decision making, counseling pertinent to today's visit, and charting.   She has responded very well to the clonidine .  I agree that increasing the dose may be more helpful.  She knows to watch for any hypotensive symptoms, prevention of orthostatic hypotension discussed.  Increase clonidine  to 0.2 mg, 1 at bedtime.  Continue Cymbalta  60 mg, 2 p.o. daily. Continue Zoloft  25 mg p.o. daily beginning 5 days prior to menses and then days 1 and 2 of  the cycle and then stop it. Continue  trazodone  100 mg,  1-2 p.o. nightly as needed sleep.  Continue multivitamin, B complex, and iron. Continue therapy. Return in 3 months.  Verneita Cooks, PA-C

## 2024-03-05 ENCOUNTER — Other Ambulatory Visit: Payer: Self-pay | Admitting: Physician Assistant

## 2024-05-16 ENCOUNTER — Ambulatory Visit

## 2024-05-16 VITALS — BP 120/82 | HR 83 | Temp 98.9°F | Ht 62.0 in | Wt 189.0 lb

## 2024-05-16 DIAGNOSIS — F5089 Other specified eating disorder: Secondary | ICD-10-CM

## 2024-05-16 DIAGNOSIS — Z113 Encounter for screening for infections with a predominantly sexual mode of transmission: Secondary | ICD-10-CM | POA: Diagnosis not present

## 2024-05-16 DIAGNOSIS — F32A Depression, unspecified: Secondary | ICD-10-CM | POA: Diagnosis not present

## 2024-05-16 DIAGNOSIS — R635 Abnormal weight gain: Secondary | ICD-10-CM

## 2024-05-16 DIAGNOSIS — F840 Autistic disorder: Secondary | ICD-10-CM | POA: Insufficient documentation

## 2024-05-16 DIAGNOSIS — F419 Anxiety disorder, unspecified: Secondary | ICD-10-CM

## 2024-05-16 DIAGNOSIS — E669 Obesity, unspecified: Secondary | ICD-10-CM

## 2024-05-16 MED ORDER — TRAZODONE HCL 100 MG PO TABS: 100.0000 mg | ORAL_TABLET | Freq: Every day | ORAL | Status: AC

## 2024-05-16 NOTE — Progress Notes (Signed)
 Subjective:   This visit was conducted in person. The patient gave informed consent to the use of [employer] AI technology to record the contents of the encounter as documented below.   Patient ID: Amber Watts, female    DOB: 2000-07-21, 23 y.o.   MRN: [id number]   Amber Watts is a very pleasant 23 y.o. female who presents today as a new patient.  Past medical, surgical and family history: Reviewed and updated in chart.  Allergies: Reviewed and updated in chart.  Medications: Reviewed and updated in chart.  Social history: Reviewed and updated in chart.  Last PCP and reason for leaving: Corean Comment NP, moved to [place], our clinic is closer  Discussed the use of AI scribe software for clinical note transcription with the patient, who gave verbal consent to proceed.  History of Present Illness Amber Watts is a 23 year old female with an eating disorder and autism who presents with weight gain.  She has a history of an eating disorder, characterized by a limited range of foods she can tolerate. She describes herself as a 'picky eater' and notes that trying new foods often results in vomiting. Her autism was diagnosed at age 8. She attends therapy once a week for support.  Over the past three years, she has experienced significant weight gain, approximately 40 pounds. She attributes this to not feeling full after meals and eating in response to emotions such as sadness or happiness. She often feels hungry or not full, even after eating a full meal, and these feelings are exacerbated by stress. 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 She occasionally drinks soda, about 12-16 ounces from a vending machine, multiple times a week.  She has a history of anxiety and depression, for which she takes Zoloft  25 mg daily. Clonidine  is taken at bedtime for anxiety, though it is not  effective. Trazodone  100 mg is used at bedtime to aid sleep, taken every night. For migraines, she uses Imitrex  100 mg as needed, about once or twice a week. She previously used naproxen  for migraines but has discontinued it. She also takes Ajovy  every 28 days, though her new insurance has stopped covering it, and Cymbalta  (duloxetine ) two tablets daily for mood management. Additionally, she takes B complex vitamins.   Review of Systems  All other systems reviewed and are negative.       BP 120/82 (BP Location: Left Arm, Patient Position: Sitting, Cuff Size: Large)   Pulse 83   Temp 98.9 F (37.2 C) (Oral)   Ht 5' 2 (1.575 m)   Wt 189 lb (85.7 kg)   LMP 05/08/2024   SpO2 98%   BMI 34.57 kg/m   Objective:     Physical Exam GENERAL: Alert, cooperative, well developed, no acute distress. Obese HEAD: Normocephalic atraumatic. EYES: Extraocular movements intact BL, pupils round, equal and reactive to light BL, conjunctivae normal BL. EXTREMITIES: No cyanosis or edema. NEUROLOGICAL: Oriented to person, place and time, no gait abnormalities, moves all extremities without gross motor or sensory deficit.           Assessment & Plan:   Assessment & Plan Weight gain Obesity  New patient, past medical and social history thoroughly reviewed and updated in chart.  Weight gain likely due to dietary habits and stress eating, it appears anxiety is not adequately treated, would recommend increasing Zoloft . Possible thyroid dysfunction to be ruled  out. Stress and mood-related eating patterns identified.  - Ordered thyroid function tests. - Referred to [facility] for dietary evaluation and management. - Advised to discuss with psychiatrist about increasing Zoloft  dose for better anxiety control. - Scheduled follow-up in three months for weight re-evaluation, if unimproved at that time would consider oral medication.  Anxiety and depressive disorders Anxiety  and depression medication management through Verneita Cooks with [facility]. Clonidine  reportedly ineffective. Stress eating contributes to weight gain.  Anxiety not well-controlled, will defer to psychiatry to adjust medications.  - Discuss with psychiatrist about increasing Zoloft  dose for better anxiety control. - Continue therapy sessions once a week. - Continue trazodone , Zoloft , clonidine  and Cymbalta  at current doses  General Health Maintenance Due for physical exam. HIV and hepatitis C screening recommended as part of age-appropriate screening.  Will hold off on any other STD testing per patient request  - Scheduled physical exam in three weeks. - Ordered HIV and hepatitis C screening.   Return in about 3 weeks (around 06/06/2024) for CPE then also in 3 months for weight .   Amber Peaster K Karlos Scadden, MD  05/16/24    Contains text generated by [employer].

## 2024-05-16 NOTE — Patient Instructions (Signed)
 Thank you for visiting [facility] today! Here's what we talked about: - Talk to psychiatrist about increasing zoloft  dose for better anxiety control - Call weight management in 2 wks if haven't heard from them

## 2024-05-17 ENCOUNTER — Ambulatory Visit: Payer: Self-pay

## 2024-05-17 LAB — HIV ANTIBODY (ROUTINE TESTING W REFLEX)
HIV 1&2 Ab, 4th Generation: NONREACTIVE
HIV FINAL INTERPRETATION: NEGATIVE

## 2024-05-17 LAB — HEPATITIS C ANTIBODY: Hepatitis C Ab: NONREACTIVE

## 2024-05-17 LAB — TSH: TSH: 0.92 u[IU]/mL (ref 0.35–5.50)

## 2024-05-23 ENCOUNTER — Ambulatory Visit: Admitting: Physician Assistant

## 2024-05-23 ENCOUNTER — Encounter: Payer: Self-pay | Admitting: Physician Assistant

## 2024-05-23 DIAGNOSIS — F411 Generalized anxiety disorder: Secondary | ICD-10-CM | POA: Diagnosis not present

## 2024-05-23 DIAGNOSIS — F840 Autistic disorder: Secondary | ICD-10-CM | POA: Diagnosis not present

## 2024-05-23 DIAGNOSIS — G4700 Insomnia, unspecified: Secondary | ICD-10-CM

## 2024-05-23 DIAGNOSIS — F3341 Major depressive disorder, recurrent, in partial remission: Secondary | ICD-10-CM

## 2024-05-23 MED ORDER — ALPRAZOLAM 0.5 MG PO TABS: 0.5000 mg | ORAL_TABLET | Freq: Every evening | ORAL | 1 refills | Status: DC | PRN

## 2024-05-23 NOTE — Progress Notes (Unsigned)
 [facility]  Patient ID: Amber Watts,  MRN: [id number]  PCP: Bennett Reuben POUR, MD  Date of Evaluation: 05/23/2024 Time spent:20 minutes  Chief Complaint:  Chief Complaint   Anxiety; Depression; Follow-up    HISTORY/CURRENT STATUS: HPI For routine med check  A lot of anxiety in the evenings, every night.  Worried about money, moving    Individual Medical History/ Review of Systems: Changes? :No   Past medications for mental health diagnoses include: Prozac , Lexapro, Wellbutrin  was ineffective, Hydroxyzine  was ineffective for sleep, Buspar didn't help, Zoloft  premenstrually  Focalin  XR caused tremor in hands, Vyvanse  wasn't helpful, Adderall IR didn't work, Concerta  was minimally effective, [place] didn't help  Allergies: Patient has no known allergies.  Current Medications:  Current Outpatient Medications:    ALPRAZolam  (XANAX ) 0.5 MG tablet, Take 1 tablet (0.5 mg total) by mouth at bedtime as needed for anxiety., Disp: 30 tablet, Rfl: 1   b complex vitamins capsule, Take 1 capsule by mouth daily., Disp: , Rfl:    DULoxetine  (CYMBALTA ) 60 MG capsule, Take 2 capsules (120 mg total) by mouth daily., Disp: 180 capsule, Rfl: 3   Fremanezumab -vfrm (AJOVY ) 225 MG/1.5ML SOAJ, Inject 1.5 mLs into the skin every 28 (twenty-eight) days., Disp: 1.5 mL, Rfl: 11   sertraline  (ZOLOFT ) 25 MG tablet, BEGIN 5 DAYS BEFORE MENSES BEGINS AND DAYS 1 AND 2 OF CYCLE, THEN STOP, Disp: 90 tablet, Rfl: 1   SUMAtriptan  (IMITREX ) 100 MG tablet, Take 1 tablet (100 mg total) by mouth as needed for up to 1 dose for migraine. May repeat in 2 hours if headache persists or recurs.  Maximum 2 tablets in 24 hours., Disp: 10 tablet, Rfl: 5   traZODone  (DESYREL ) 100 MG tablet, Take 1 tablet (100 mg total) by mouth at bedtime., Disp: , Rfl:  Medication Side Effects: none  Family Medical/ Social History: Changes? No  MENTAL HEALTH EXAM:  Last menstrual period 05/08/2024.There is no height or weight  on file to calculate BMI.  General Appearance: Casual, Well Groomed, and Obese  Eye Contact:  Good  Speech:  Clear and Coherent and Normal Rate  Volume:  Normal  Mood:  Euthymic  Affect:  Flat  Thought Process:  Goal Directed and Descriptions of Associations: Circumstantial  Orientation:  Full (Time, Place, and Person)  Thought Content: Logical   Suicidal Thoughts:  No  Homicidal Thoughts:  No  Memory:  WNL  Judgement:  Good  Insight:  Good  Psychomotor Activity:  Normal  Concentration:  Concentration: Good and Attention Span: Fair  Recall:  Good  Fund of Knowledge: Good  Language: Good  Assets:  Communication Skills Desire for Improvement Financial Resources/Insurance Housing Physical Health Resilience Transportation  ADL's:  Intact  Cognition: WNL  Prognosis:  Good   DIAGNOSES:  No diagnosis found.   Receiving Psychotherapy: Yes    [facility] in [place], was referred there by [facility] d/t ins and the fact that they specialize in ASD in adults.  RECOMMENDATIONS:  PDMP was reviewed.  Concerta  filled 09/25/2023.                      clonidine   0.2 mg, 1 at bedtime.  Continue Cymbalta  60 mg, 2 p.o. daily. Continue Zoloft  25 mg p.o. daily beginning 5 days prior to menses and then days 1 and 2 of the cycle and then stop it. Continue  trazodone  100 mg,  1-2 p.o. nightly as needed sleep.  Continue multivitamin, B complex, and  iron. Continue therapy. Return in 3 months.  Verneita Cooks, PA-C

## 2024-05-25 ENCOUNTER — Other Ambulatory Visit: Payer: Self-pay | Admitting: Physician Assistant

## 2024-07-04 ENCOUNTER — Ambulatory Visit: Admitting: Physician Assistant

## 2024-07-04 ENCOUNTER — Encounter: Payer: Self-pay | Admitting: Physician Assistant

## 2024-07-04 DIAGNOSIS — G4700 Insomnia, unspecified: Secondary | ICD-10-CM

## 2024-07-04 DIAGNOSIS — F840 Autistic disorder: Secondary | ICD-10-CM

## 2024-07-04 DIAGNOSIS — F411 Generalized anxiety disorder: Secondary | ICD-10-CM

## 2024-07-04 MED ORDER — GABAPENTIN 100 MG PO CAPS: 100.0000 mg | ORAL_CAPSULE | Freq: Two times a day (BID) | ORAL | 1 refills | Status: AC | PRN

## 2024-07-04 NOTE — Progress Notes (Signed)
 "     [facility]  Patient ID: Amber Watts,  MRN: [id number]  PCP: Bennett Reuben POUR, MD  Date of Evaluation: 07/04/2024 Time spent:20 minutes  Chief Complaint:  Chief Complaint   Anxiety; Follow-up    HISTORY/CURRENT STATUS: HPI For routine med check  Xanax  was added at LOV for anxiety/ruminating thoughts. It just makes her sleepy, doesn't help the anxiety.  2-3 times per week she has trouble relaxing at night b/c of the looping thoughts.  Worries about a lot of different things. No PA, but is overwhelmed a lot. Hasn't taken the Xanax  during the day.   Energy and motivation are good.  Work is going well.   No extreme sadness, tearfulness, or feelings of hopelessness.  Sleeps well most of the time. ADLs and personal hygiene are normal.   Denies any changes in concentration, making decisions, or remembering things.  Appetite has not changed.  Weight is stable. No mania, delirium, AH/VH.  No SI/HI.  Individual Medical History/ Review of Systems: Changes? :No   Past medications for mental health diagnoses include: Prozac , Lexapro, Wellbutrin  was ineffective, Hydroxyzine  was ineffective for sleep, Buspar didn't help, Zoloft  premenstrually, Xanax  made her too sleepy and did not help anxiety  Focalin  XR caused tremor in hands, Vyvanse  wasn't helpful, Adderall IR didn't work, Concerta  was minimally effective, Jornay didn't help  Allergies: Patient has no known allergies.  Current Medications:  Current Outpatient Medications:    b complex vitamins capsule, Take 1 capsule by mouth daily., Disp: , Rfl:    DULoxetine  (CYMBALTA ) 60 MG capsule, Take 2 capsules (120 mg total) by mouth daily., Disp: 180 capsule, Rfl: 3   gabapentin  (NEURONTIN ) 100 MG capsule, Take 1-2 capsules (100-200 mg total) by mouth 2 (two) times daily as needed., Disp: 60 capsule, Rfl: 1   sertraline  (ZOLOFT ) 25 MG tablet, BEGIN 5 DAYS BEFORE MENSES BEGINS AND DAYS 1 AND 2 OF CYCLE, THEN STOP, Disp: 90 tablet, Rfl:  1   Fremanezumab -vfrm (AJOVY ) 225 MG/1.5ML SOAJ, Inject 1.5 mLs into the skin every 28 (twenty-eight) days. (Patient not taking: Reported on 07/04/2024), Disp: 1.5 mL, Rfl: 11   SUMAtriptan  (IMITREX ) 100 MG tablet, Take 1 tablet (100 mg total) by mouth as needed for up to 1 dose for migraine. May repeat in 2 hours if headache persists or recurs.  Maximum 2 tablets in 24 hours. (Patient not taking: Reported on 07/04/2024), Disp: 10 tablet, Rfl: 5   traZODone  (DESYREL ) 100 MG tablet, Take 1 tablet (100 mg total) by mouth at bedtime. (Patient not taking: Reported on 07/04/2024), Disp: , Rfl:  Medication Side Effects: none  Family Medical/ Social History: Changes? No  MENTAL HEALTH EXAM:  There were no vitals taken for this visit.There is no height or weight on file to calculate BMI.  General Appearance: Casual, Well Groomed, and Obese  Eye Contact:  Good  Speech:  Clear and Coherent and Normal Rate  Volume:  Normal  Mood:  Euthymic  Affect:  Flat  Thought Process:  Goal Directed and Descriptions of Associations: Circumstantial  Orientation:  Full (Time, Place, and Person)  Thought Content: Logical   Suicidal Thoughts:  No  Homicidal Thoughts:  No  Memory:  WNL  Judgement:  Good  Insight:  Good  Psychomotor Activity:  Restlessness  Concentration:  Concentration: Good and Attention Span: Fair  Recall:  Good  Fund of Knowledge: Good  Language: Good  Assets:  Communication Skills Desire for Improvement Financial Resources/Insurance Housing Physical Health Resilience  Transportation Vocational/Educational  ADL's:  Intact  Cognition: WNL  Prognosis:  Good   DIAGNOSES:    ICD-10-CM   1. Generalized anxiety disorder  F41.1     2. Insomnia, unspecified type  G47.00     3. Autism  F84.0       Receiving Psychotherapy: Yes    [facility].  RECOMMENDATIONS:  PDMP was reviewed.  Xanax  filled 06/26/2024.  Concerta  filled 09/25/2023. I provided approximately 20 minutes of face to  face time during this encounter, including time spent before and after the visit in records review, medical decision making, counseling pertinent to today's visit, and charting.   We discussed the anxiety.  I recommend stopping the Xanax  as it is not effective anyway.  Recommend starting gabapentin  as needed.  Benefits, risk and side effects were discussed and she accepts.  Continue Cymbalta  60 mg, 2 p.o. daily. Start gabapentin  100 mg, 1-2 p.o. twice daily as needed anxiety. Continue Zoloft  25 mg p.o. daily beginning 5 days prior to menses and then days 1 and 2 of the cycle and then stop it. Continue  trazodone  100 mg,  1-2 p.o. nightly as needed sleep.  Continue multivitamin, B complex, and iron. Continue therapy. Return in 6 weeks.  Verneita Cooks, PA-C  "

## 2024-07-13 ENCOUNTER — Encounter

## 2024-07-13 NOTE — Progress Notes (Signed)
 " Medical Nutrition Therapy  Appointment Start time:  [phone]  Appointment End time:  1710  Primary concerns today: Pt desired weight reductions to a lower BMI range.   Referral diagnosis: weight gain, obesity Preferred learning style: no preference indicated Learning readiness: not ready-contemplating  NUTRITION ASSESSMENT   Clinical Medical Hx:  Past Medical History:  Diagnosis Date   ADHD (attention deficit hyperactivity disorder)    Anxiety    Autistic disorder    Depression    Dysmenorrhea 05/10/2020   Persistent headaches 01/01/2022   Viral upper respiratory tract infection 04/17/2020    Medications: Current Medications[1]  Labs:  Lab Results  Component Value Date   CHOL 140 05/09/2020   HDL 51.60 05/09/2020   LDLCALC 77 05/09/2020   TRIG 60.0 05/09/2020   CHOLHDL 3 05/09/2020   No results found for: HGBA1C  Notable Signs/Symptoms:  Wt Readings from Last 3 Encounters:  05/16/24 189 lb (85.7 kg)  12/10/23 181 lb (82.1 kg)  02/19/23 175 lb (79.4 kg)   Lifestyle & Dietary Hx Pt presents today alone. Pt reports she is working full time mostly sitting and travels local weekly. Pt desires to improve her eating habits. Pt reports a desire to promote weight reductions to a lower BMI range. Pt reports she is in therapy weekly. 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 Pt reports her appetite is not well controlled. Pt reports daily intake of sugary sweetened beverages. Pt reports her typical intake of 3 meals daily and states intake of snacks on days off work. Pt reports she eats out about 3-6 times weekly. Pt reports she lives with her parent and parent do the cooking and shopping. Pt reports she accepts ~ 50 foods currently and willing to try broccoli and a fruit smoothie. All Pt's questions were answered during this encounter  Estimated daily fluid intake: 32 oz Supplements: MVI, b  complex Sleep: 7-8 hours nightly  Stress / self-care: 8 out of 10 / self care includes: therapy, work boundaries, gym  Current average weekly physical activity: gym: weights 2/d/w for 30 minutes  24-Hr Dietary Recall First Meal: 2 slice of white bread toasted with butter, 2% milk or biscuit with cheese and bacon, 16 oz dr pepper Snack: none Second Meal: peanut butter, 2 slices white bread, goldfish, ~8 oz ginger ale or olive garden: pasta with alfredo, bread stick  Snack: 1-2 handful of chips or 2 oreos cookies  Third Meal: 2-3 slices of thin crust pizza pepperoni, ~ 8 oz ginger ale or dr pepper or skips 1/d/w  Snack:  2 oreos cookies  Beverages: water, gingerale, 2% milk, dr pepper  NUTRITION DIAGNOSIS  NB-1.1 Food and nutrition-related knowledge deficit As related to limited prior nutrition related education.  As evidenced by Pt reports and dietary recall .   NUTRITION INTERVENTION  Nutrition education (E-1) on the following topics:  Fruits & Vegetables: Aim to fill half your plate with a variety of fruits and vegetables. They are rich in vitamins, minerals, and fiber, and can help reduce the risk of chronic diseases. Choose a colorful assortment of fruits and vegetables to ensure you get a wide range of nutrients. Grains and Starches: Make at least half of your grain choices whole grains, such as brown rice, whole wheat bread, and oats. Whole grains provide fiber, which aids in digestion and healthy cholesterol levels. Aim for whole forms of starchy vegetables such as potatoes,  sweet potatoes, beans, peas, and corn, which are fiber rich and provide many vitamins and minerals.  Protein: Incorporate lean sources of protein, such as poultry, fish, beans, nuts, and seeds, into your meals. Protein is essential for building and repairing tissues, staying full, balancing blood sugar, as well as supporting immune function. Dairy: Include low-fat or fat-free dairy products like milk, yogurt, and  cheese in your diet. Dairy foods are excellent sources of calcium and vitamin D , which are crucial for bone health.  Physical Activity: Aim for 60 minutes of physical activity daily. Regular physical activity promotes overall health-including helping to reduce risk for heart disease and diabetes, promoting mental health, and helping us  sleep better.   Handouts Provided Include  Plate Planner-[employer] Move Your way-[facility]  Learning Style & Readiness for Change Teaching method utilized: Visual & Auditory  Demonstrated degree of understanding via: Teach Back  Barriers to learning/adherence to lifestyle change: texture concerns, picky eating   Goals Established by Pt Try broccoli 15-20 times prepared in different ways to determine acceptance Increase physical activity by walking at work or at home on 2-3 days weekly and time your efforts Limit intake of sugary sweetened beverages to 8 ounces on 2-3 days weekly   MONITORING & EVALUATION Dietary intake, weekly physical activity  Next Steps  Patient is to return PRN.    [1]  Current Outpatient Medications:    b complex vitamins capsule, Take 1 capsule by mouth daily., Disp: , Rfl:    DULoxetine  (CYMBALTA ) 60 MG capsule, Take 2 capsules (120 mg total) by mouth daily., Disp: 180 capsule, Rfl: 3   gabapentin  (NEURONTIN ) 100 MG capsule, Take 1-2 capsules (100-200 mg total) by mouth 2 (two) times daily as needed., Disp: 60 capsule, Rfl: 1   sertraline  (ZOLOFT ) 25 MG tablet, BEGIN 5 DAYS BEFORE MENSES BEGINS AND DAYS 1 AND 2 OF CYCLE, THEN STOP, Disp: 90 tablet, Rfl: 1   Fremanezumab -vfrm (AJOVY ) 225 MG/1.5ML SOAJ, Inject 1.5 mLs into the skin every 28 (twenty-eight) days. (Patient not taking: Reported on 07/04/2024), Disp: 1.5 mL, Rfl: 11   SUMAtriptan  (IMITREX ) 100 MG tablet, Take 1 tablet (100 mg total) by mouth as needed for up to 1 dose for migraine. May repeat in 2 hours if headache persists or recurs.  Maximum 2 tablets in 24 hours. (Patient not  taking: Reported on 07/04/2024), Disp: 10 tablet, Rfl: 5   traZODone  (DESYREL ) 100 MG tablet, Take 1 tablet (100 mg total) by mouth at bedtime. (Patient not taking: Reported on 07/04/2024), Disp: , Rfl:   "

## 2024-07-18 ENCOUNTER — Encounter: Admitting: Dietician

## 2024-07-18 DIAGNOSIS — R635 Abnormal weight gain: Secondary | ICD-10-CM | POA: Diagnosis present

## 2024-07-18 DIAGNOSIS — E669 Obesity, unspecified: Secondary | ICD-10-CM | POA: Insufficient documentation

## 2024-07-18 DIAGNOSIS — Z713 Dietary counseling and surveillance: Secondary | ICD-10-CM | POA: Diagnosis not present

## 2024-07-18 NOTE — Progress Notes (Unsigned)
 "  Virtual Visit via Video Note:   Consent was obtained for video visit:  Yes.   Answered questions that patient had about telehealth interaction:  Yes.   I discussed the limitations, risks, security and privacy concerns of performing an evaluation and management service by telemedicine. I also discussed with the patient that there may be a patient responsible charge related to this service. The patient expressed understanding and agreed to proceed.  Pt location: Home Physician Location: office Name of referring provider:  Lucius Krabbe, NP I connected with Amber Watts at patients initiation/request on 07/19/2024 at  9:10 AM EST by video enabled telemedicine application and verified that I am speaking with the correct person using two identifiers. Pt MRN:  [id number] Pt DOB:  08-13-2000 Video Participants:  Amber Watts  Assessment/Plan:    Migraine without aura, without status migrainosus, not intractable   Migraine prevention:  Will try to restart Ajovy .  It may be that she needs a new prior authorization with her new insurance Migraine rescue:  refill sumatriptan  100mg  Limit use of pain relievers to no more than 9 days out of the month to prevent risk of rebound or medication-overuse headache. Keep headache diary Follow up 6 months.     Subjective:  Amber Watts is a 24 year old right-handed female with ADHD, Autism spectrum disorder, depression and anxiety who follows up for migraines.    UPDATE: She has been out of Ajovy  for 2 months because she changed insurance and they wouldn't cover it.  She also ran out of sumatriptan . Headaches have increased. Intensity:  5/10 Duration:  2 to 3 hours with ibuprofen  or Tylenol  Frequency:  2 to 3 days a week Frequency of abortive medication: 4 days a month Current NSAIDS/analgesics:  Tylenol , ibuprofen  Current triptans:  none Current ergotamine:  none Current anti-emetic:  none Current muscle relaxants:  none Current  Antihypertensive medications:  clonidine  (for ADHD and anxiety) Current Antidepressant medications:  duloxetine  120mg  daily, sertraline  25mg  (perimenstrual prophylaxis), trazodone  100-200mg  at bedtime PRN (insomnia) Current Anticonvulsant medications:  none Current anti-CGRP:  none Current Vitamins/Herbal/Supplements:  none Current Antihistamines/Decongestants:  none Other therapy:  none Birth control:  none    Caffeine:  Dr. Nunzio.  No coffee Alcohol:  No Smoking:  No Diet:  32 oz water daily.  Does not skip meals. Exercise:  difficult due to lack of energy Depression:  stable but mostly feels fatigued; Anxiety:  improved overall Sleep hygiene:  Improved with trazodone .  Sometimes stills wake up in the middle of the night.  HISTORY: Migraines: She has had headaches since childhood.  Became more severe around age 24.  Starts with dull bilateral retro-orbital, jaw or neck pain.  Headache is moderate (sometimes severe) non-throbbing bifrontal pain.  Associated with nausea, photophobia, phonophobia, osmophobia, blurred vision, one pupil may be dilated.  She denies vomiting, lacrimation, rhinorrhea, ptosis, unilateral numbness or weakness.  Lasts all day (either continuously or off and on for 3-4 hours).  Occurs 2-3 times a week (5-6 times a week prior to Emgality ).  Hunger or smells of lotions, soap or perfumes may be a trigger.    Family history of headache:  sister, father  Presyncope: She has history of presyncope since around 24 years old.  Feels lightheaded, vision starts to jitter, sweating, feels hot, nausea, and palpitations.  No associated tunnel vision, chest pain and never lost consciousness.  Occurs spontaneously (such as while sitting) but typically notices it with prolonged standing.  Occurs alone but may  occur with headaches.  Lasts few minutes.  May occur anywhere from once a week to 6-7 times a week.  Takes propranolol  as needed for palpitations/tachycardia.  Not helpful for  headaches.  She reports no cardiac workup.    Imaging: MRI of brain without contrast (IV access unable to be achieved) on 03/11/2023 showed severe left sphenoid sinus mucosal thickening but normal brain.  Past medications: Past NSAIDS/analgesics:  naproxen , ibuprofen  800mg  Past abortive triptans:  sumatriptan  100mg  Past abortive ergotamine:  none Past muscle relaxants:  cyclobenzaprine  5mg  Past anti-emetic:  Zofran  ODT 8mg  Past antihypertensive medications:  none Past antidepressant medications:  Wellbutrin , fluoxetine  Past anticonvulsant medications:  none Past anti-CGRP:  Emgality , Nurtec PRN, Ajovy  (effective, no longer covered by insurance) Past vitamins/Herbal/Supplements:  none Past antihistamines/decongestants:  none Other past therapies:  none   Past Medical History: Past Medical History:  Diagnosis Date   ADHD (attention deficit hyperactivity disorder)    Anxiety    Autistic disorder    Depression    Dysmenorrhea 05/10/2020   Persistent headaches 01/01/2022   Viral upper respiratory tract infection 04/17/2020    Medications: Outpatient Encounter Medications as of 07/19/2024  Medication Sig   b complex vitamins capsule Take 1 capsule by mouth daily.   DULoxetine  (CYMBALTA ) 60 MG capsule Take 2 capsules (120 mg total) by mouth daily.   Fremanezumab -vfrm (AJOVY ) 225 MG/1.5ML SOAJ Inject 1.5 mLs into the skin every 28 (twenty-eight) days. (Patient not taking: Reported on 07/04/2024)   gabapentin  (NEURONTIN ) 100 MG capsule Take 1-2 capsules (100-200 mg total) by mouth 2 (two) times daily as needed.   sertraline  (ZOLOFT ) 25 MG tablet BEGIN 5 DAYS BEFORE MENSES BEGINS AND DAYS 1 AND 2 OF CYCLE, THEN STOP   SUMAtriptan  (IMITREX ) 100 MG tablet Take 1 tablet (100 mg total) by mouth as needed for up to 1 dose for migraine. May repeat in 2 hours if headache persists or recurs.  Maximum 2 tablets in 24 hours. (Patient not taking: Reported on 07/04/2024)   traZODone  (DESYREL ) 100 MG  tablet Take 1 tablet (100 mg total) by mouth at bedtime. (Patient not taking: Reported on 07/04/2024)   No facility-administered encounter medications on file as of 07/19/2024.    Allergies: Allergies[1]  Family History: Family History  Problem Relation Age of Onset   Depression Mother    Breast cancer Mother    Diabetes Father    Carpal tunnel syndrome Father    Anxiety disorder Father    Migraines Sister    Asthma Sister    Anxiety disorder Sister    Stroke Maternal Grandfather    Heart disease Maternal Grandfather    Heart attack Paternal Grandmother    Dementia Paternal Grandfather     Observations/Objective:   No acute distress.  Alert and oriented.  Speech fluent and not dysarthric.  Language intact.  Eyes orthophoric on primary gaze.  Face symmetric.   Follow up Instructions:      -I discussed the assessment and treatment plan with the patient. The patient was provided an opportunity to ask questions and all were answered. The patient agreed with the plan and demonstrated an understanding of the instructions.   The patient was advised to call back or seek an in-person evaluation if the symptoms worsen or if the condition fails to improve as anticipated.   Juliene Lamar Dunnings, DO   CC: Reuben Burkes, MD           [1] No Known Allergies  "

## 2024-07-18 NOTE — Patient Instructions (Signed)
 Try broccoli 15-20 times prepared in different ways to determine acceptance Increase physical activity by walking at work or at home on 2-3 days weekly and time your efforts Limit intake of sugary sweetened beverages to 8 ounces on 2-3 days weekly

## 2024-07-19 ENCOUNTER — Encounter: Payer: Self-pay | Admitting: Neurology

## 2024-07-19 ENCOUNTER — Telehealth: Admitting: Neurology

## 2024-07-19 DIAGNOSIS — G43009 Migraine without aura, not intractable, without status migrainosus: Secondary | ICD-10-CM

## 2024-07-19 MED ORDER — AJOVY 225 MG/1.5ML ~~LOC~~ SOAJ: 1.5000 mL | SUBCUTANEOUS | 11 refills | Status: AC

## 2024-07-20 ENCOUNTER — Telehealth: Payer: Self-pay | Admitting: Neurology

## 2024-07-20 NOTE — Telephone Encounter (Signed)
 Amber Watts called in stating that she wanted to let provider know that [facility] informed her that the insurance denied Sumatriptan .  PH: [phone] .

## 2024-07-20 NOTE — Telephone Encounter (Signed)
 LMOVM for patient per Pharmacy insurance want cover 10 tabs it will cover 9. So she change it to 9 tabs and the patient can pick up.

## 2024-08-16 ENCOUNTER — Ambulatory Visit

## 2024-08-18 ENCOUNTER — Ambulatory Visit: Admitting: Physician Assistant

## 2025-01-25 ENCOUNTER — Ambulatory Visit: Payer: Self-pay | Admitting: Neurology
